# Patient Record
Sex: Male | Born: 1965 | Race: Black or African American | Hispanic: No | Marital: Married | Smoking: Never smoker
Health system: Southern US, Community
[De-identification: ages and names within clinical notes are randomized; demographics above are authoritative.]

## PROBLEM LIST (undated history)

## (undated) DIAGNOSIS — J45909 Unspecified asthma, uncomplicated: Secondary | ICD-10-CM

## (undated) DIAGNOSIS — I10 Essential (primary) hypertension: Secondary | ICD-10-CM

## (undated) DIAGNOSIS — G4730 Sleep apnea, unspecified: Secondary | ICD-10-CM

## (undated) HISTORY — PX: FRACTURE SURGERY: SHX138

## (undated) HISTORY — PX: DIAGNOSTIC LAPAROSCOPY: SUR761

## (undated) HISTORY — PX: ELBOW FRACTURE SURGERY: SHX616

## (undated) HISTORY — PX: LAPAROSCOPIC GASTRIC SLEEVE RESECTION: SHX5895

---

## 2007-05-02 ENCOUNTER — Ambulatory Visit: Payer: Self-pay | Admitting: Internal Medicine

## 2010-11-24 ENCOUNTER — Ambulatory Visit: Payer: Self-pay | Admitting: Sports Medicine

## 2016-02-10 DIAGNOSIS — E668 Other obesity: Secondary | ICD-10-CM | POA: Insufficient documentation

## 2016-02-10 DIAGNOSIS — R12 Heartburn: Secondary | ICD-10-CM | POA: Insufficient documentation

## 2016-02-10 DIAGNOSIS — G4733 Obstructive sleep apnea (adult) (pediatric): Secondary | ICD-10-CM | POA: Insufficient documentation

## 2016-02-10 DIAGNOSIS — I10 Essential (primary) hypertension: Secondary | ICD-10-CM | POA: Insufficient documentation

## 2016-02-12 ENCOUNTER — Other Ambulatory Visit: Payer: Self-pay | Admitting: Bariatrics

## 2016-02-19 ENCOUNTER — Ambulatory Visit
Admission: RE | Admit: 2016-02-19 | Discharge: 2016-02-19 | Disposition: A | Discharge status: Home / Self Care | Payer: BC Managed Care – PPO | Source: Ambulatory Visit | Attending: Bariatrics | Admitting: Bariatrics

## 2016-02-19 DIAGNOSIS — K449 Diaphragmatic hernia without obstruction or gangrene: Secondary | ICD-10-CM | POA: Insufficient documentation

## 2016-03-15 NOTE — Progress Notes (Signed)
Cardiology Office Note   Date:  03/22/2016   ID:  [name], DOB 1965/09/23, MRN [id number]  Referring Doctor:  Lyndon Code, MD   Cardiologist:   Almond Lint, MD   Reason for consultation:  Chief Complaint  Patient presents with  . New Patient (Initial Visit)    NO CP, SOB OR SWELLING. NO OTHER COMPLAINTS.   Preoperative cardiac clearance   History of Present Illness: Luis Hendrix. is a 50 y.o. male who presents for Preoperative evaluation prior to gastric sleeve surgery.  Patient was told that he had an abnormal exercise stress test. He does not report any chest pain or shortness of breath. No palpitations, lightheadedness, syncope.  He would like to proceed with gastric sleeve surgery to help with weight loss. His physical activities limited by history of almost ruptured Achilles tendon on the right leg.   ROS:  Please see the history of present illness. Aside from mentioned under HPI, all other systems are reviewed and negative.     History reviewed. No pertinent past medical history.  HTN OSA Asthma  History reviewed. No pertinent surgical history.   reports that he has never smoked. He has never used smokeless tobacco. He reports that he drinks alcohol.   family history is not on file.   No outpatient prescriptions prior to visit.   No facility-administered medications prior to visit.    Lisinopril Symbicort  Allergies: Review of patient's allergies indicates no known allergies.    PHYSICAL EXAM: VS:  BP (!) 134/100 (BP Location: Left Arm, Patient Position: Sitting, Cuff Size: Large)   Pulse 75   Ht 6\' 1"  (1.854 m)   Wt (!) 333 lb 12.8 oz (151.4 kg)   BMI 44.04 kg/m  , Body mass index is 44.04 kg/m. Wt Readings from Last 3 Encounters:  03/22/16 (!) 333 lb 12.8 oz (151.4 kg)    GENERAL:  well developed, well nourished,morbidly obese, not in acute distress HEENT: normocephalic, pink conjunctivae, anicteric sclerae, no  xanthelasma, normal dentition, oropharynx clear NECK:  no neck vein engorgement, JVP normal, no hepatojugular reflux, carotid upstroke brisk and symmetric, no bruit, no thyromegaly, no lymphadenopathy LUNGS:  good respiratory effort, clear to auscultation bilaterally CV:  PMI not displaced, no thrills, no lifts, S1 and S2 within normal limits, no palpable S3 or S4, no murmurs, no rubs, no gallops ABD:  Soft, nontender, nondistended, normoactive bowel sounds, no abdominal aortic bruit, no hepatomegaly, no splenomegaly MS: nontender back, no kyphosis, no scoliosis, no joint deformities EXT:  2+ DP/PT pulses, no edema, no varicosities, no cyanosis, no clubbing SKIN: warm, nondiaphoretic, normal turgor, no ulcers NEUROPSYCH: alert, oriented to person, place, and time, sensory/motor grossly intact, normal mood, appropriate affect  Recent Labs: No results found for requested labs within last 8760 hours.   Lipid Panel No results found for: CHOL, TRIG, HDL, CHOLHDL, VLDL, LDLCALC, LDLDIRECT   Other studies Reviewed:  EKG:  The ekg from 03/22/2016 was personally reviewed by me and it revealed sinus rhythm, 75 BPM  Additional studies/ records that were reviewed personally reviewed by me today include: None available   ASSESSMENT AND PLAN: Preoperative cardiac evaluation Patient underwent exercise treadmill test 02/25/2016. He did not reach target heart rate despite exercising for more than 11 minutes. He had adequate local ST depression at peak heart rate. It was recommended that he go for nuclear stress test. Echocardiogram was done 02/25/2016. LVEF is 65-70%. No other significant findings. We will  arrange for a pharmacologic nuclear stress test. If this is negative for ischemia, his cardiac risk will be low to intermediate for moderate risk procedure.  Hypertension Blood pressure slightly elevated. PCP managing this.   Current medicines are reviewed at length with the patient today.  The  patient does not have concerns regarding medicines.  Labs/ tests ordered today include:  Orders Placed This Encounter  Procedures  . NM Myocar Multi W/Spect W/Wall Motion / EF  . EKG 12-Lead    I had a lengthy and detailed discussion with the patient regarding diagnoses, prognosis, diagnostic options, treatment options.   I counseled the patient on importance of lifestyle modification including heart healthy diet, regular physical activity.   Disposition:   FU with undersigned after tests prn  I spent at least 60 minutes with the patient today and more than 50% of the time was spent counseling the patient and coordinating care.    Signed, Almond LintAileen Daylin Gruszka, MD  03/22/2016 1:10 PM    [facility]  This note was generated in part with voice recognition software and I apologize for any typographical errors that were not detected and corrected.

## 2016-03-22 ENCOUNTER — Encounter: Payer: Self-pay | Admitting: Cardiology

## 2016-03-22 ENCOUNTER — Encounter (INDEPENDENT_AMBULATORY_CARE_PROVIDER_SITE_OTHER): Payer: Self-pay

## 2016-03-22 ENCOUNTER — Ambulatory Visit (INDEPENDENT_AMBULATORY_CARE_PROVIDER_SITE_OTHER): Payer: BC Managed Care – PPO | Admitting: Cardiology

## 2016-03-22 VITALS — BP 134/100 | HR 75 | Ht 73.0 in | Wt 333.8 lb

## 2016-03-22 DIAGNOSIS — I10 Essential (primary) hypertension: Secondary | ICD-10-CM | POA: Diagnosis not present

## 2016-03-22 DIAGNOSIS — R9439 Abnormal result of other cardiovascular function study: Secondary | ICD-10-CM

## 2016-03-22 DIAGNOSIS — Z01818 Encounter for other preprocedural examination: Secondary | ICD-10-CM | POA: Diagnosis not present

## 2016-03-22 NOTE — Patient Instructions (Addendum)
Testing/Procedures: [facility]  Your caregiver has ordered a Stress Test with nuclear imaging. The purpose of this test is to evaluate the blood supply to your heart muscle. This procedure is referred to as a "Non-Invasive Stress Test." This is because other than having an IV started in your vein, nothing is inserted or "invades" your body. Cardiac stress tests are done to find areas of poor blood flow to the heart by determining the extent of coronary artery disease (CAD).    Please note: these test may take anywhere between 2-4 hours to complete  PLEASE REPORT TO [facility] ENTRANCE  THE VOLUNTEERS AT THE FIRST DESK WILL DIRECT YOU WHERE TO GO  Date of Procedure:_Tuesday September 12 & Wednesday March 30, 2016 at 08:30AM_  Arrival Time for Procedure:_Arrive at 08:15AM to register__   PLEASE NOTIFY THE OFFICE AT LEAST 24 HOURS IN ADVANCE IF YOU ARE UNABLE TO KEEP YOUR APPOINTMENT.  [phone] AND  PLEASE NOTIFY NUCLEAR MEDICINE AT [facility] AT LEAST 24 HOURS IN ADVANCE IF YOU ARE UNABLE TO KEEP YOUR APPOINTMENT. [phone]  How to prepare for your Myoview test:  1. Do not eat or drink after midnight 2. No caffeine for 24 hours prior to test 3. No smoking 24 hours prior to test. 4. Your medication may be taken with water.  If your doctor stopped a medication because of this test, do not take that medication. 5. Ladies, please do not wear dresses.  Skirts or pants are appropriate. Please wear a short sleeve shirt. 6. No perfume, cologne or lotion. 7. Wear comfortable walking shoes. No heels!      Follow-Up: Your physician recommends that you schedule a follow-up appointment as needed. We will call you with results and schedule follow up at that time if needed.  It was a pleasure seeing you today here in the office. Please do not hesitate to give us a call back if you have any further questions. [phone]  Noma CellarPamela A. RN, BSN        Cardiac Nuclear Scanning A  cardiac nuclear scan is used to check your heart for problems, such as the following:  A portion of the heart is not getting enough blood.  Part of the heart muscle has died, which happens with a heart attack.  The heart wall is not working normally.  In this test, a radioactive dye (tracer) is injected into your bloodstream. After the tracer has traveled to your heart, a scanning device is used to measure how much of the tracer is absorbed by or distributed to various areas of your heart. LET [facility] KNOW ABOUT:  Any allergies you have.  All medicines you are taking, including vitamins, herbs, eye drops, creams, and over-the-counter medicines.  Previous problems you or members of your family have had with the use of anesthetics.  Any blood disorders you have.  Previous surgeries you have had.  Medical conditions you have.  RISKS AND COMPLICATIONS Generally, this is a safe procedure. However, as with any procedure, problems can occur. Possible problems include:   Serious chest pain.  Rapid heartbeat.  Sensation of warmth in your chest. This usually passes quickly. BEFORE THE PROCEDURE Ask your health care provider about changing or stopping your regular medicines. PROCEDURE This procedure is usually done at a hospital and takes 2-4 hours.  An IV tube is inserted into one of your veins.  Your health care provider will inject a small amount of radioactive tracer through the tube.  You will then wait for 20-40 minutes while the tracer travels through your bloodstream.  You will lie down on an exam table so images of your heart can be taken. Images will be taken for about 15-20 minutes.  You will exercise on a treadmill or stationary bike. While you exercise, your heart activity will be monitored with an electrocardiogram (ECG), and your blood pressure will be checked.  If you are unable to exercise, you may be given a medicine to make your heart beat  faster.  When blood flow to your heart has peaked, tracer will again be injected through the IV tube.  After 20-40 minutes, you will get back on the exam table and have more images taken of your heart.  When the procedure is over, your IV tube will be removed. AFTER THE PROCEDURE  You will likely be able to leave shortly after the test. Unless your health care provider tells you otherwise, you may return to your normal schedule, including diet, activities, and medicines.  Make sure you find out how and when you will get your test results.   This information is not intended to replace advice given to you by your health care provider. Make sure you discuss any questions you have with your health care provider.   Document Released: 07/29/2004 Document Revised: 07/09/2013 Document Reviewed: 06/12/2013 Elsevier Interactive Patient Education [employer]

## 2016-03-28 ENCOUNTER — Telehealth: Payer: Self-pay | Admitting: Cardiology

## 2016-03-28 NOTE — Telephone Encounter (Signed)
Pt needs to cancel his stress test tomorrow, due to his work schedule. Please call to r/s

## 2016-03-28 NOTE — Telephone Encounter (Signed)
Reviewed with patient time, location, and instructions for stress test. 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 Let him know that he should be finished by that time and he stated to leave appointment as it stands and he will be there. Confirmed all instructions and he verbalized understanding with no further questions.

## 2016-03-29 ENCOUNTER — Other Ambulatory Visit: Payer: BC Managed Care – PPO

## 2016-03-29 ENCOUNTER — Ambulatory Visit
Admission: RE | Admit: 2016-03-29 | Discharge: 2016-03-29 | Disposition: A | Discharge status: Home / Self Care | Payer: BC Managed Care – PPO | Source: Ambulatory Visit | Attending: Cardiology | Admitting: Cardiology

## 2016-03-29 DIAGNOSIS — R9439 Abnormal result of other cardiovascular function study: Secondary | ICD-10-CM | POA: Diagnosis not present

## 2016-03-29 DIAGNOSIS — Z01818 Encounter for other preprocedural examination: Secondary | ICD-10-CM

## 2016-03-29 HISTORY — DX: Unspecified asthma, uncomplicated: J45.909

## 2016-03-29 HISTORY — DX: Sleep apnea, unspecified: G47.30

## 2016-03-29 HISTORY — DX: Essential (primary) hypertension: I10

## 2016-03-29 LAB — NM MYOCAR MULTI W/SPECT W/WALL MOTION / EF
Estimated workload: 1 METS
Exercise duration (min): 0 min
Exercise duration (sec): 0 s
MPHR: 173 {beats}/min
Peak HR: 83 {beats}/min
Percent HR: 47 %
Rest HR: 74 {beats}/min

## 2016-03-29 MED ORDER — TECHNETIUM TC 99M TETROFOSMIN IV KIT
31.5200 | PACK | Freq: Once | INTRAVENOUS | Status: AC | PRN
  Administered 2016-03-29: 31.52 via INTRAVENOUS

## 2016-03-29 MED ORDER — REGADENOSON 0.4 MG/5ML IV SOLN
0.4000 mg | Freq: Once | INTRAVENOUS | Status: AC
  Administered 2016-03-29: 0.4 mg via INTRAVENOUS
  Filled 2016-03-29: qty 5

## 2016-03-30 ENCOUNTER — Encounter: Admission: RE | Admit: 2016-03-30 | Payer: BC Managed Care – PPO | Source: Ambulatory Visit

## 2016-03-30 ENCOUNTER — Other Ambulatory Visit: Payer: BC Managed Care – PPO

## 2016-04-08 ENCOUNTER — Telehealth: Payer: Self-pay | Admitting: Cardiology

## 2016-04-08 NOTE — Telephone Encounter (Signed)
Received cardiac clearance request from [facility] for bariatric surgery. Forms placed in red folder in "To Do" bin on Pamela's desk.

## 2016-04-11 ENCOUNTER — Telehealth: Payer: Self-pay | Admitting: Cardiology

## 2016-04-11 NOTE — Telephone Encounter (Signed)
Cardiac clearance form faxed to [facility] and office visit note with stress test results routed through Epic as well. Original form placed in "Faxed" bin on Raneen Jaffer's desk.

## 2017-05-05 IMAGING — RF DG UGI W/O KUB
10 series · 10 of 10 positions shown · non-contrast
Comparison: None.

CLINICAL DATA: Pre bariatric evaluation

EXAM:
UPPER GI SERIES WITHOUT KUB
TECHNIQUE: Routine upper GI series was performed with thin/high density/water
soluble barium. Effervescent crystals and a barium tablet were
administered
FLUOROSCOPY TIME:  Fluoroscopy Time (in minutes and seconds): 1
minutes, 36 seconds
Number of Acquired Images:  10

[Series 1: fluoro_barium 2fps_bw · 0.17mm/px · 1 of 1 slices shown (1 of 10)]
[im 1/1]
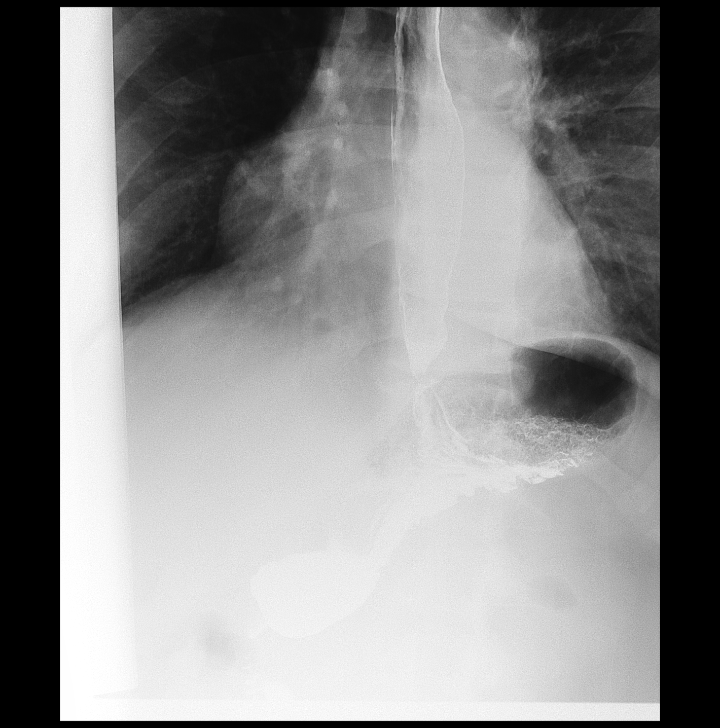

[Series 2: fluoro_barium 2fps_bw · 0.18mm/px · 1 of 1 slices shown (2 of 10)]
[im 1/1]
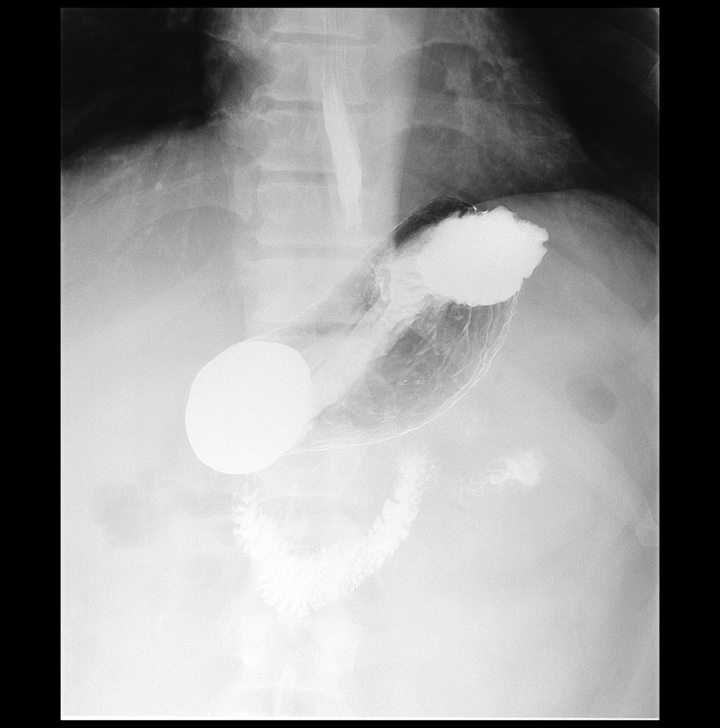

[Series 3: fluoro_barium 2fps_bw · 0.18mm/px · 1 of 1 slices shown (3 of 10)]
[im 1/1]
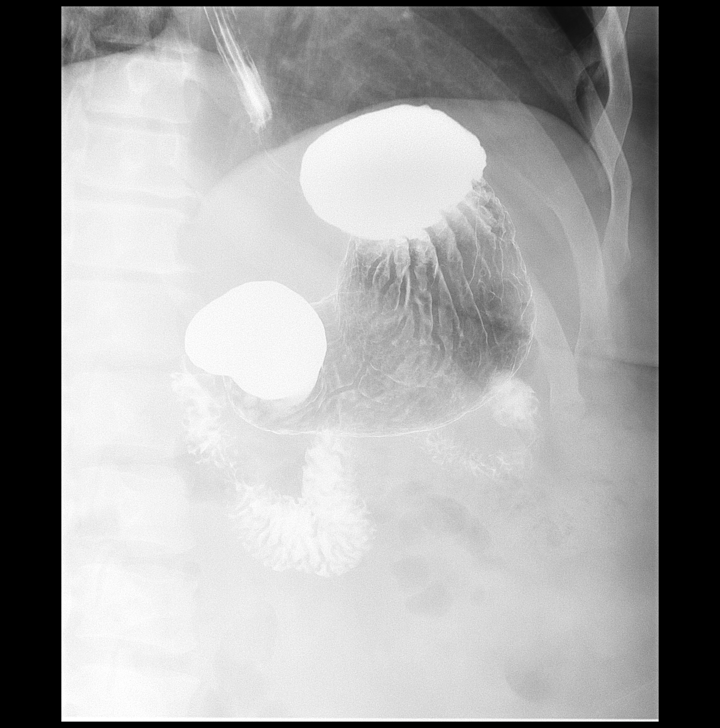

[Series 4: fluoro_barium 2fps_bw · 0.18mm/px · 1 of 1 slices shown (4 of 10)]
[im 1/1]
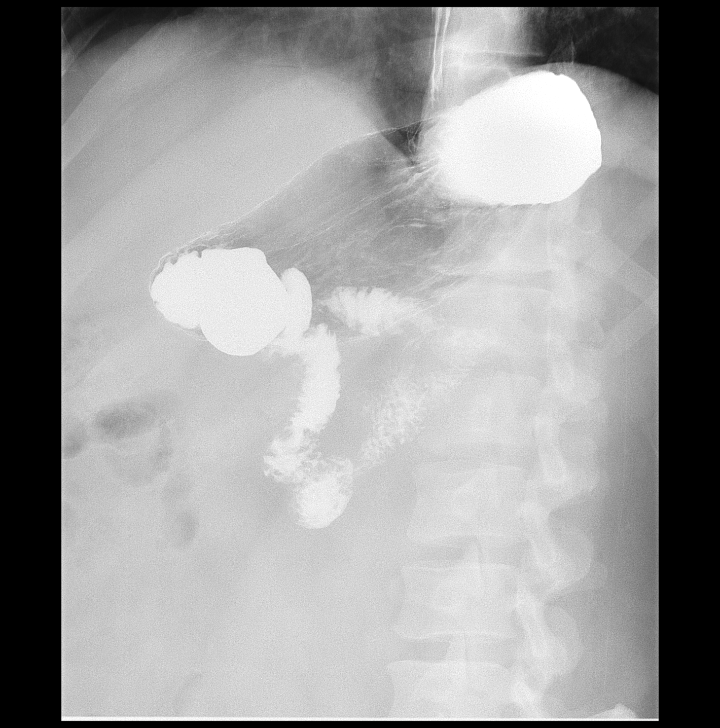

[Series 5: fluoro_barium 2fps_bw · 0.18mm/px · 1 of 1 slices shown (5 of 10)]
[im 1/1]
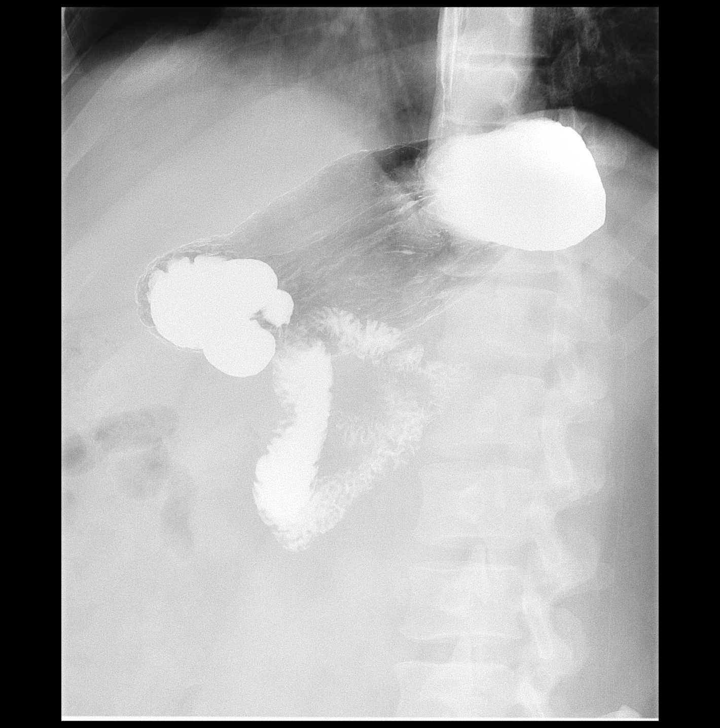

[Series 6: fluoro_barium 2fps_bw · 0.18mm/px · 1 of 1 slices shown (6 of 10)]
[im 1/1]
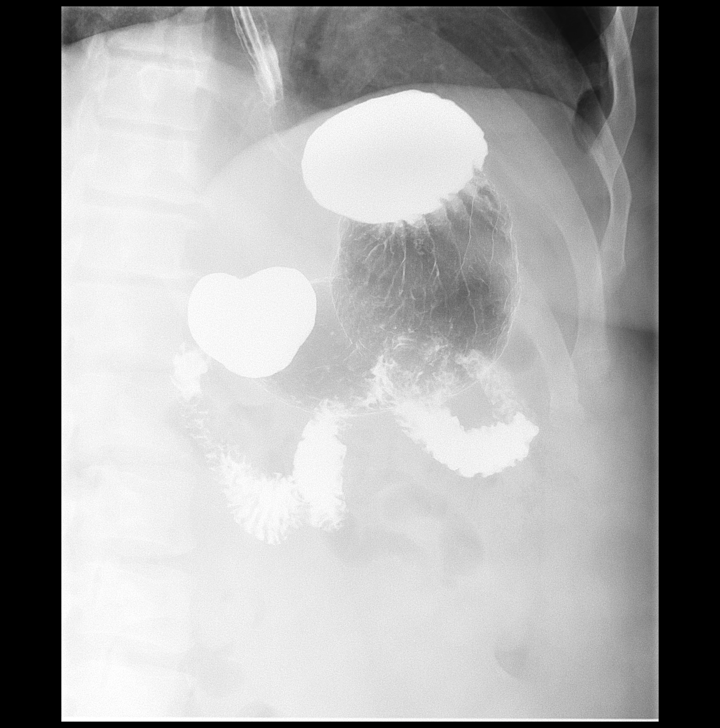

[Series 7: fluoro_barium 2fps_bw · 0.18mm/px · 1 of 1 slices shown (7 of 10)]
[im 1/1]
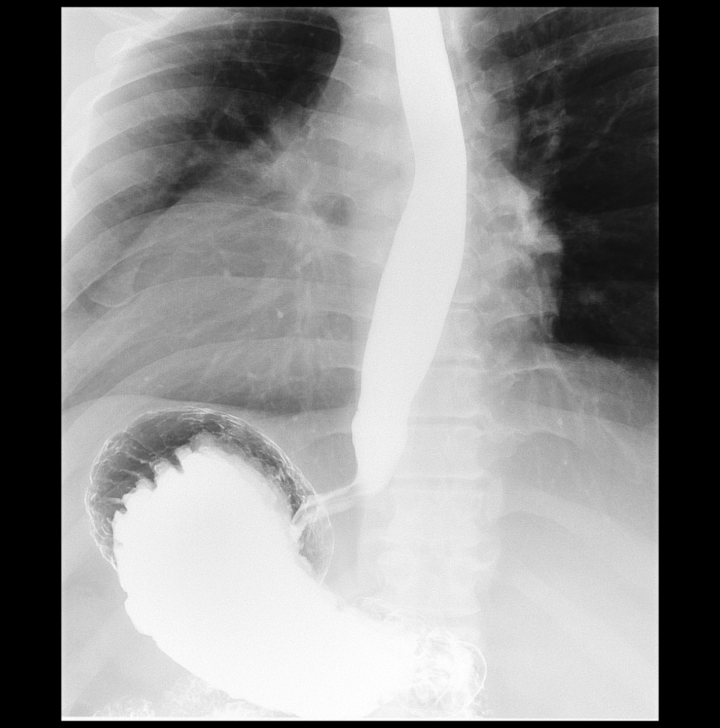

[Series 8: fluoro_barium 2fps_bw · 0.18mm/px · 1 of 1 slices shown (8 of 10)]
[im 1/1]
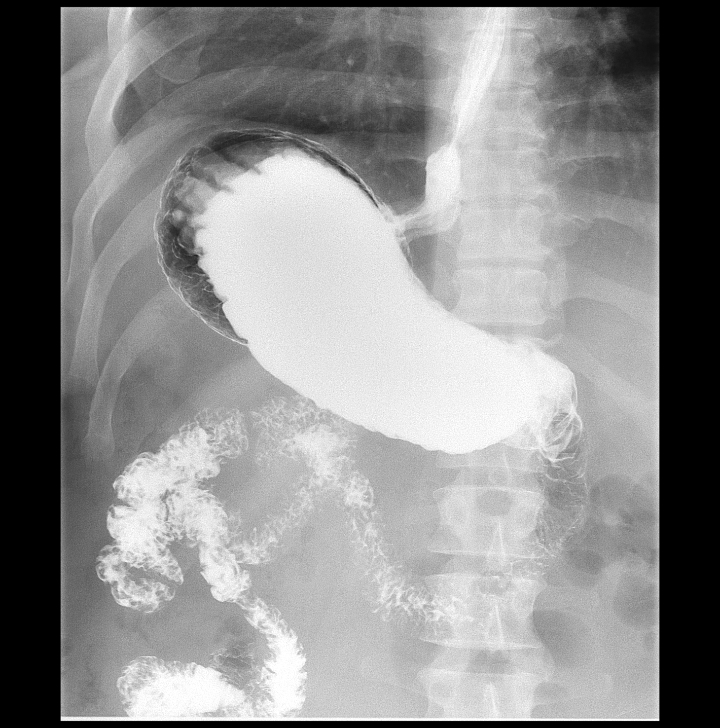

[Series 9: fluoro_barium 2fps_bw · 0.17mm/px · 1 of 1 slices shown (9 of 10)]
[im 1/1]
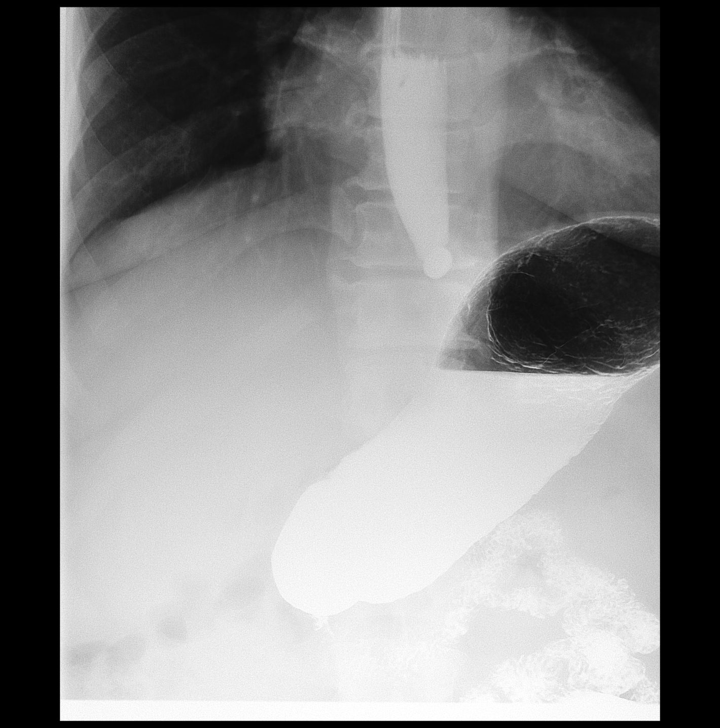

[Series 10: fluoro_barium 2fps_bw · 0.18mm/px · 1 of 1 slices shown (10 of 10)]
[im 1/1]
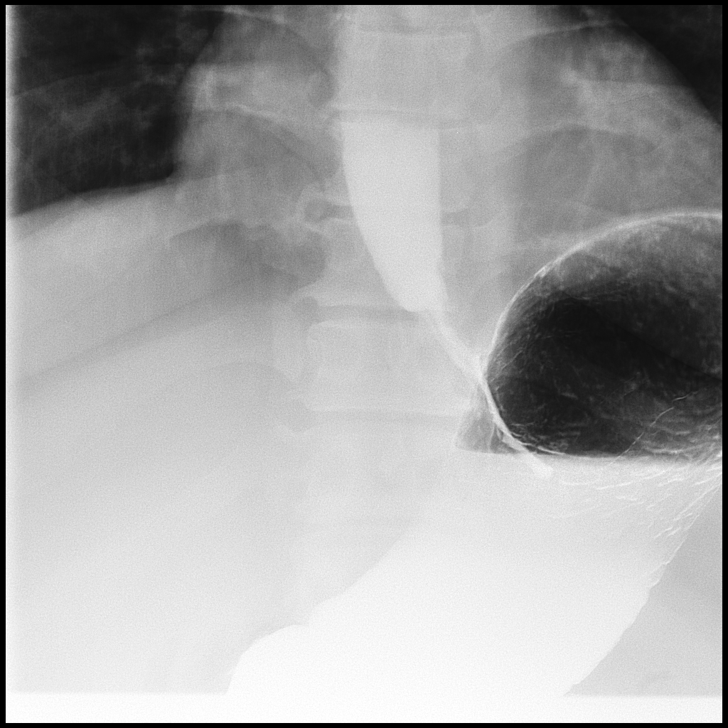

[10 of 10 positions shown; findings below may reference images not displayed]

FINDINGS: The patient ingested the thick and thin barium and gas forming
crystals without difficulty. The thoracic esophagus was normal in a
survey fashion. A small reducible hiatal hernia was observed. The
stomach distended well. The mucosal fold pattern was normal. There
was no ulcer niche. Gastric emptying was prompt. The duodenal bulb
and C-loop were normal in appearance and position. The barium tablet
passed promptly from the mouth to the distal esophagus where it
hung. It would not pass even with additional sips of water and
barium and was allowed to dissolve.
IMPRESSION: 1. Obstruction to passage of the barium tablet at the GE junction
which is the site of a small reducible hiatal hernia. No obstruction
to passage of liquid barium. The mucosa of the distal esophagus
appears normal.
2. Normal appearance of the stomach and duodenum.

## 2017-12-15 ENCOUNTER — Ambulatory Visit: Payer: BC Managed Care – PPO | Admitting: Nurse Practitioner

## 2017-12-15 ENCOUNTER — Encounter: Payer: Self-pay | Admitting: Nurse Practitioner

## 2017-12-15 VITALS — BP 128/86 | HR 72 | Resp 16 | Ht 73.0 in | Wt 275.0 lb

## 2017-12-15 DIAGNOSIS — E668 Other obesity: Secondary | ICD-10-CM | POA: Diagnosis not present

## 2017-12-15 DIAGNOSIS — J45909 Unspecified asthma, uncomplicated: Secondary | ICD-10-CM | POA: Insufficient documentation

## 2017-12-15 DIAGNOSIS — I10 Essential (primary) hypertension: Secondary | ICD-10-CM | POA: Diagnosis not present

## 2017-12-15 MED ORDER — PHENTERMINE HCL 37.5 MG PO TABS: 37.5000 mg | ORAL_TABLET | Freq: Every day | ORAL | 1 refills | Status: DC

## 2017-12-15 NOTE — Progress Notes (Signed)
[facility] [address] [place], [place] [address]  Internal MEDICINE  Office Visit Note  Patient Name: Luis Hendrix  [id number]  [id number]  Date of Service: 12/15/2017  Chief Complaint  Patient presents with  . Hypertension    follow up  . Weight Gain    3 or 4 punds over past 6 weeks or so.     The patient is concerned about recent weight gain of 3-4 pounds in past 6 weeks. Did speak with his bariatric doctor. Suggested he try appetite suppressant for short period of time.  States that he is fairly active. Works out 4 to 5 times per week and is very active around the house. States that reintroduction of carbs and comfort foods has hurt him.  Blood pressure is slightly elevated today. States that he checks this every day at home and generally running in 120s over 80s.    Pt is here for routine follow up.    Current Medication: Outpatient Encounter Medications as of 12/15/2017  Medication Sig Note  . albuterol (PROAIR HFA) 108 (90 Base) MCG/ACT inhaler ProAir HFA 90 mcg/actuation aerosol inhaler   . colchicine 0.6 MG tablet colchicine 0.6 mg tablet   . lisinopril (PRINIVIL,ZESTRIL) 10 MG tablet Take 10 mg by mouth daily. 03/22/2016: Received from: External Pharmacy  . SYMBICORT 160-4.5 MCG/ACT inhaler Inhale 1 puff into the lungs 2 (two) times daily as needed. 03/22/2016: Received from: External Pharmacy  . phentermine (ADIPEX-P) 37.5 MG tablet Take 1 tablet (37.5 mg total) by mouth daily before breakfast.    No facility-administered encounter medications on file as of 12/15/2017.     Surgical History: Past Surgical History:  Procedure Laterality Date  . ELBOW FRACTURE SURGERY Right    Approx age 52  . LAPAROSCOPIC GASTRIC SLEEVE RESECTION      Medical History: Past Medical History:  Diagnosis Date  . Asthma   . Hypertension   . Sleep apnea     Family History: Family History  Problem Relation Age of Onset  . Hypertension Father     Social  History   Socioeconomic History  . Marital status: Married    Spouse name: Not on file  . Number of children: Not on file  . Years of education: Not on file  . Highest education level: Not on file  Occupational History  . Not on file  Social Needs  . Financial resource strain: Not on file  . Food insecurity:    Worry: Not on file    Inability: Not on file  . Transportation needs:    Medical: Not on file    Non-medical: Not on file  Tobacco Use  . Smoking status: Never Smoker  . Smokeless tobacco: Never Used  Substance and Sexual Activity  . Alcohol use: Yes    Comment: OCCASIONALLY  . Drug use: No  . Sexual activity: Yes    Partners: Female  Lifestyle  . Physical activity:    Days per week: Not on file    Minutes per session: Not on file  . Stress: Not on file  Relationships  . Social connections:    Talks on phone: Not on file    Gets together: Not on file    Attends religious service: Not on file    Active member of club or organization: Not on file    Attends meetings of clubs or organizations: Not on file    Relationship status: Not on file  . Intimate partner  violence:    Fear of current or ex partner: Not on file    Emotionally abused: Not on file    Physically abused: Not on file    Forced sexual activity: Not on file  Other Topics Concern  . Not on file  Social History Narrative  . Not on file      Review of Systems  Constitutional: Negative for activity change, chills, fatigue and unexpected weight change.       Recent weight gain of 3-4 pounds.   HENT: Negative for congestion, postnasal drip, rhinorrhea, sneezing and sore throat.   Eyes: Negative.  Negative for redness.  Respiratory: Negative for cough, chest tightness, shortness of breath and wheezing.   Cardiovascular: Negative for chest pain and palpitations.  Gastrointestinal: Negative for abdominal pain, constipation, diarrhea, nausea and vomiting.  Endocrine: Negative for cold intolerance,  heat intolerance, polydipsia, polyphagia and polyuria.  Genitourinary: Negative.  Negative for dysuria and frequency.  Musculoskeletal: Negative for arthralgias, back pain, joint swelling and neck pain.  Skin: Negative for rash.  Allergic/Immunologic: Positive for environmental allergies.  Neurological: Negative for tremors, numbness and headaches.  Hematological: Negative for adenopathy. Does not bruise/bleed easily.  Psychiatric/Behavioral: Negative for behavioral problems (Depression), sleep disturbance and suicidal ideas. The patient is not nervous/anxious.     Vital Signs: BP 128/86   Pulse 72   Resp 16   Ht 6\' 1"  (1.854 m)   Wt 275 lb (124.7 kg)   SpO2 98%   BMI 36.28 kg/m    Physical Exam  Constitutional: He is oriented to person, place, and time. He appears well-developed and well-nourished. No distress.  HENT:  Head: Normocephalic and atraumatic.  Nose: Nose normal.  Mouth/Throat: Oropharynx is clear and moist. No oropharyngeal exudate.  Eyes: Pupils are equal, round, and reactive to light. EOM are normal.  Neck: Normal range of motion. Neck supple. No JVD present. No tracheal deviation present. No thyromegaly present.  Cardiovascular: Normal rate, regular rhythm and normal heart sounds. Exam reveals no gallop and no friction rub.  No murmur heard. Pulmonary/Chest: Effort normal and breath sounds normal. No respiratory distress. He has no wheezes. He has no rales. He exhibits no tenderness.  Abdominal: Soft. Bowel sounds are normal. There is no tenderness.  Musculoskeletal: Normal range of motion.  Lymphadenopathy:    He has no cervical adenopathy.  Neurological: He is alert and oriented to person, place, and time. No cranial nerve deficit.  Skin: Skin is warm and dry. He is not diaphoretic.  Psychiatric: He has a normal mood and affect. His behavior is normal. Judgment and thought content normal.  Nursing note and vitals reviewed.   Assessment/Plan: 1. Essential  hypertension Stable. Continue bp medication as prescribed.  2. Moderate asthma without complication, unspecified whether persistent Symbicort twice daily. Rescue inhaler as needed and as prescribed.   3. Moderate obesity Add phentermine 37.5mg  tablets. Reviewed importance of following 1800 calorie diet. Continue routine exercise program.  - phentermine (ADIPEX-P) 37.5 MG tablet; Take 1 tablet (37.5 mg total) by mouth daily before breakfast.  Dispense: 30 tablet; Refill: 1   General Counseling: [name] verbalizes understanding of the findings of todays visit and agrees with plan of treatment. I have discussed any further diagnostic evaluation that may be needed or ordered today. We also reviewed his medications today. he has been encouraged to call the office with any questions or concerns that should arise related to todays visit.   There is a liability release in patients' chart.  There has been a 10 minute discussion about the side effects including but not limited to elevated blood pressure, anxiety, lack of sleep and dry mouth. Pt understands and will like to start/continue on appetite suppressant at this time. There will be one month RX given at the time of visit with proper follow up. Nova diet plan with restricted calories is given to the pt. Pt understands and agrees with  plan of treatment  This patient was seen by Vincent Gros, FNP- C in Collaboration with Dr Lyndon Code as a part of collaborative care agreement   Meds ordered this encounter  Medications  . phentermine (ADIPEX-P) 37.5 MG tablet    Sig: Take 1 tablet (37.5 mg total) by mouth daily before breakfast.    Dispense:  30 tablet    Refill:  1    Order Specific Question:   Supervising Provider    Answer:   Lyndon Code [[id number]]    Time spent: 17 Minutes     Dr Lyndon Code Internal medicine

## 2018-01-19 ENCOUNTER — Encounter: Payer: Self-pay | Admitting: Nurse Practitioner

## 2018-01-19 ENCOUNTER — Ambulatory Visit: Payer: BC Managed Care – PPO | Admitting: Nurse Practitioner

## 2018-01-19 ENCOUNTER — Encounter (INDEPENDENT_AMBULATORY_CARE_PROVIDER_SITE_OTHER): Payer: Self-pay

## 2018-01-19 VITALS — BP 118/85 | HR 82 | Resp 16 | Ht 73.0 in | Wt 263.0 lb

## 2018-01-19 DIAGNOSIS — J45909 Unspecified asthma, uncomplicated: Secondary | ICD-10-CM | POA: Diagnosis not present

## 2018-01-19 DIAGNOSIS — I10 Essential (primary) hypertension: Secondary | ICD-10-CM

## 2018-01-19 DIAGNOSIS — E668 Other obesity: Secondary | ICD-10-CM

## 2018-01-19 MED ORDER — PHENTERMINE HCL 37.5 MG PO TABS: 37.5000 mg | ORAL_TABLET | Freq: Every day | ORAL | 1 refills | Status: DC

## 2018-01-19 NOTE — Progress Notes (Signed)
[facility] [address] [place], [place] [address]  Internal MEDICINE  Office Visit Note  Patient Name: Luis Hendrix.  [id number]  [id number]  Date of Service: 01/19/2018   Pt is here for routine follow up  Chief Complaint  Patient presents with  . Other     follow up    The patient is here for weight loss follow up. Restarted phentermine 37.5mg  tablets. Has lost 12 pounds since his last visit. Has cut out carbs and carbonated drinks. Splurges very seldom. Increased intensity of work outs. Feeling well and has no negative side effects .   Marland Kitchen.    Current Medication: Outpatient Encounter Medications as of 01/19/2018  Medication Sig Note  . albuterol (PROAIR HFA) 108 (90 Base) MCG/ACT inhaler ProAir HFA 90 mcg/actuation aerosol inhaler   . colchicine 0.6 MG tablet colchicine 0.6 mg tablet   . lisinopril (PRINIVIL,ZESTRIL) 10 MG tablet Take 10 mg by mouth daily. 03/22/2016: Received from: External Pharmacy  . phentermine (ADIPEX-P) 37.5 MG tablet Take 1 tablet (37.5 mg total) by mouth daily before breakfast.   . SYMBICORT 160-4.5 MCG/ACT inhaler Inhale 1 puff into the lungs 2 (two) times daily as needed. 03/22/2016: Received from: External Pharmacy  . [DISCONTINUED] phentermine (ADIPEX-P) 37.5 MG tablet Take 1 tablet (37.5 mg total) by mouth daily before breakfast.    No facility-administered encounter medications on file as of 01/19/2018.     Surgical History: Past Surgical History:  Procedure Laterality Date  . ELBOW FRACTURE SURGERY Right    Approx age 89  . LAPAROSCOPIC GASTRIC SLEEVE RESECTION      Medical History: Past Medical History:  Diagnosis Date  . Asthma   . Hypertension   . Sleep apnea     Family History: Family History  Problem Relation Age of Onset  . Hypertension Father     Social History   Socioeconomic History  . Marital status: Married    Spouse name: Not on file  . Number of children: Not on file  . Years of education: Not on  file  . Highest education level: Not on file  Occupational History  . Not on file  Social Needs  . Financial resource strain: Not on file  . Food insecurity:    Worry: Not on file    Inability: Not on file  . Transportation needs:    Medical: Not on file    Non-medical: Not on file  Tobacco Use  . Smoking status: Never Smoker  . Smokeless tobacco: Never Used  Substance and Sexual Activity  . Alcohol use: Yes    Comment: OCCASIONALLY  . Drug use: No  . Sexual activity: Yes    Partners: Female  Lifestyle  . Physical activity:    Days per week: Not on file    Minutes per session: Not on file  . Stress: Not on file  Relationships  . Social connections:    Talks on phone: Not on file    Gets together: Not on file    Attends religious service: Not on file    Active member of club or organization: Not on file    Attends meetings of clubs or organizations: Not on file    Relationship status: Not on file  . Intimate partner violence:    Fear of current or ex partner: Not on file    Emotionally abused: Not on file    Physically abused: Not on file    Forced sexual activity: Not on  file  Other Topics Concern  . Not on file  Social History Narrative  . Not on file      Review of Systems  Constitutional: Negative for activity change, chills, fatigue and unexpected weight change.       Weight loss of 12 pounds since most recent visit.   HENT: Negative for congestion, postnasal drip, rhinorrhea, sneezing and sore throat.   Eyes: Negative.  Negative for redness.  Respiratory: Negative for cough, chest tightness, shortness of breath and wheezing.   Cardiovascular: Negative for chest pain and palpitations.  Gastrointestinal: Negative for abdominal pain, constipation, diarrhea, nausea and vomiting.  Endocrine: Negative for cold intolerance, heat intolerance, polydipsia, polyphagia and polyuria.  Genitourinary: Negative.  Negative for dysuria and frequency.  Musculoskeletal:  Negative for arthralgias, back pain, joint swelling and neck pain.  Skin: Negative for rash.  Allergic/Immunologic: Positive for environmental allergies.  Neurological: Negative for tremors, numbness and headaches.  Hematological: Negative for adenopathy. Does not bruise/bleed easily.  Psychiatric/Behavioral: Negative for behavioral problems (Depression), sleep disturbance and suicidal ideas. The patient is not nervous/anxious.     Vital Signs: BP 118/85   Pulse 82   Resp 16   Ht 6\' 1"  (1.854 m)   Wt 263 lb (119.3 kg)   SpO2 96%   BMI 34.70 kg/m    Physical Exam  Constitutional: He is oriented to person, place, and time. He appears well-developed and well-nourished. No distress.  HENT:  Head: Normocephalic and atraumatic.  Nose: Nose normal.  Mouth/Throat: Oropharynx is clear and moist. No oropharyngeal exudate.  Eyes: Pupils are equal, round, and reactive to light. EOM are normal.  Neck: Normal range of motion. Neck supple. No JVD present. No tracheal deviation present. No thyromegaly present.  Cardiovascular: Normal rate, regular rhythm and normal heart sounds. Exam reveals no gallop and no friction rub.  No murmur heard. Pulmonary/Chest: Effort normal and breath sounds normal. No respiratory distress. He has no wheezes. He has no rales. He exhibits no tenderness.  Abdominal: Soft. Bowel sounds are normal. There is no tenderness.  Musculoskeletal: Normal range of motion.  Lymphadenopathy:    He has no cervical adenopathy.  Neurological: He is alert and oriented to person, place, and time. No cranial nerve deficit.  Skin: Skin is warm and dry. Capillary refill takes less than 2 seconds. He is not diaphoretic.  Psychiatric: He has a normal mood and affect. His behavior is normal. Judgment and thought content normal.  Nursing note and vitals reviewed.  Assessment/Plan:  1. Essential hypertension Blood pressure doing very well. Continue lisinopril/hctz as prescribed  2.  Moderate asthma without complication, unspecified whether persistent  symbicort twice daily. Use rescue inhaler as needed and as prescribed.   3. Moderate obesity Improving. Continue phentermine as prescribed. Continue with dietary changes and increased exercise.  - phentermine (ADIPEX-P) 37.5 MG tablet; Take 1 tablet (37.5 mg total) by mouth daily before breakfast.  General Counseling: Armstead Peaks understanding of the findings of todays visit and agrees with plan of treatment. I have discussed any further diagnostic evaluation that may be needed or ordered today. We also reviewed his medications today. he has been encouraged to call the office with any questions or concerns that should arise related to todays visit.    Counseling:   There is a liability release in patients' chart. There has been a 10 minute discussion about the side effects including but not limited to elevated blood pressure, anxiety, lack of sleep and dry mouth. Pt understands  and will like to start/continue on appetite suppressant at this time. There will be one month RX given at the time of visit with proper follow up. Nova diet plan with restricted calories is given to the pt. Pt understands and agrees with  plan of treatment  This patient was seen by Vincent Gros, FNP- C in Collaboration with Dr Lyndon Code as a part of collaborative care agreement    Meds ordered this encounter  Medications  . phentermine (ADIPEX-P) 37.5 MG tablet    Sig: Take 1 tablet (37.5 mg total) by mouth daily before breakfast.    Dispense:  30 tablet    Refill:  1    Order Specific Question:   Supervising Provider    Answer:   Lyndon Code [[id number]]    Time spent: 24 Minutes          Dr Lyndon Code Internal medicine

## 2018-03-02 ENCOUNTER — Ambulatory Visit: Payer: BC Managed Care – PPO | Admitting: Nurse Practitioner

## 2018-03-02 ENCOUNTER — Encounter: Payer: Self-pay | Admitting: Nurse Practitioner

## 2018-03-02 VITALS — BP 124/86 | HR 83 | Resp 16 | Ht 74.0 in | Wt 265.0 lb

## 2018-03-02 DIAGNOSIS — J45909 Unspecified asthma, uncomplicated: Secondary | ICD-10-CM | POA: Diagnosis not present

## 2018-03-02 DIAGNOSIS — I10 Essential (primary) hypertension: Secondary | ICD-10-CM | POA: Diagnosis not present

## 2018-03-02 DIAGNOSIS — E668 Other obesity: Secondary | ICD-10-CM

## 2018-03-02 MED ORDER — PHENTERMINE HCL 37.5 MG PO TABS: 37.5000 mg | ORAL_TABLET | Freq: Every day | ORAL | 1 refills | Status: DC

## 2018-03-02 NOTE — Progress Notes (Signed)
[facility] [address] [place], [place] [address]   Internal MEDICINE  Office Visit Note  Patient Name: Luis PieriniGeorge L Barresi Jr.  [id number]  [id number]  Date of Service: 03/02/2018  Chief Complaint  Patient presents with  . Obesity    6wk weight management follow up    The patient has had a 2 pound weight gain since his last visit. Is currently taking phentermine to help reduce appetite. Is eating a 1500 calorie diet and is exercising more frequently. Has maintained good blood blood pressure and is feeling well.       Current Medication: Outpatient Encounter Medications as of 03/02/2018  Medication Sig Note  . albuterol (PROAIR HFA) 108 (90 Base) MCG/ACT inhaler ProAir HFA 90 mcg/actuation aerosol inhaler   . colchicine 0.6 MG tablet colchicine 0.6 mg tablet   . lisinopril (PRINIVIL,ZESTRIL) 10 MG tablet Take 10 mg by mouth daily. 03/22/2016: Received from: External Pharmacy  . phentermine (ADIPEX-P) 37.5 MG tablet Take 1 tablet (37.5 mg total) by mouth daily before breakfast.   . SYMBICORT 160-4.5 MCG/ACT inhaler Inhale 1 puff into the lungs 2 (two) times daily as needed. 03/22/2016: Received from: External Pharmacy  . [DISCONTINUED] phentermine (ADIPEX-P) 37.5 MG tablet Take 1 tablet (37.5 mg total) by mouth daily before breakfast.    No facility-administered encounter medications on file as of 03/02/2018.     Surgical History: Past Surgical History:  Procedure Laterality Date  . ELBOW FRACTURE SURGERY Right    Approx age 89  . LAPAROSCOPIC GASTRIC SLEEVE RESECTION      Medical History: Past Medical History:  Diagnosis Date  . Asthma   . Hypertension   . Sleep apnea     Family History: Family History  Problem Relation Age of Onset  . Hypertension Father     Social History   Socioeconomic History  . Marital status: Married    Spouse name: Not on file  . Number of children: Not on file  . Years of education: Not on file  . Highest education level:  Not on file  Occupational History  . Not on file  Social Needs  . Financial resource strain: Not on file  . Food insecurity:    Worry: Not on file    Inability: Not on file  . Transportation needs:    Medical: Not on file    Non-medical: Not on file  Tobacco Use  . Smoking status: Never Smoker  . Smokeless tobacco: Never Used  Substance and Sexual Activity  . Alcohol use: Yes    Comment: OCCASIONALLY  . Drug use: No  . Sexual activity: Yes    Partners: Female  Lifestyle  . Physical activity:    Days per week: Not on file    Minutes per session: Not on file  . Stress: Not on file  Relationships  . Social connections:    Talks on phone: Not on file    Gets together: Not on file    Attends religious service: Not on file    Active member of club or organization: Not on file    Attends meetings of clubs or organizations: Not on file    Relationship status: Not on file  . Intimate partner violence:    Fear of current or ex partner: Not on file    Emotionally abused: Not on file    Physically abused: Not on file    Forced sexual activity: Not on file  Other Topics Concern  . Not on  file  Social History Narrative  . Not on file      Review of Systems  Constitutional: Negative for activity change, chills, fatigue and unexpected weight change.       Weight loss of 2 pounds since most recent visit.   HENT: Negative for congestion, postnasal drip, rhinorrhea, sneezing and sore throat.   Eyes: Negative.  Negative for redness.  Respiratory: Negative for cough, chest tightness, shortness of breath and wheezing.   Cardiovascular: Negative for chest pain and palpitations.  Gastrointestinal: Negative for abdominal pain, constipation, diarrhea, nausea and vomiting.  Endocrine: Negative for cold intolerance, heat intolerance, polydipsia, polyphagia and polyuria.  Genitourinary: Negative.  Negative for dysuria and frequency.  Musculoskeletal: Negative for arthralgias, back pain,  joint swelling and neck pain.  Skin: Negative for rash.  Allergic/Immunologic: Positive for environmental allergies.  Neurological: Negative for tremors, numbness and headaches.  Hematological: Negative for adenopathy. Does not bruise/bleed easily.  Psychiatric/Behavioral: Negative for behavioral problems (Depression), sleep disturbance and suicidal ideas. The patient is not nervous/anxious.     Vital Signs: BP 124/86 (BP Location: Right Arm, Patient Position: Sitting, Cuff Size: Large)   Pulse 83   Resp 16   Ht 6\' 2"  (1.88 m)   Wt 265 lb (120.2 kg)   HC 16" (40.6 cm)   SpO2 99%   BMI 34.02 kg/m    Physical Exam  Constitutional: He is oriented to person, place, and time. He appears well-developed and well-nourished. No distress.  HENT:  Head: Normocephalic and atraumatic.  Nose: Nose normal.  Mouth/Throat: Oropharynx is clear and moist. No oropharyngeal exudate.  Eyes: Pupils are equal, round, and reactive to light. Conjunctivae and EOM are normal.  Neck: Normal range of motion. Neck supple. No JVD present. No tracheal deviation present. No thyromegaly present.  Cardiovascular: Normal rate, regular rhythm and normal heart sounds. Exam reveals no gallop and no friction rub.  No murmur heard. Pulmonary/Chest: Effort normal and breath sounds normal. No respiratory distress. He has no wheezes. He has no rales. He exhibits no tenderness.  Abdominal: Soft. Bowel sounds are normal. There is no tenderness.  Musculoskeletal: Normal range of motion.  Lymphadenopathy:    He has no cervical adenopathy.  Neurological: He is alert and oriented to person, place, and time. No cranial nerve deficit.  Skin: Skin is warm and dry. Capillary refill takes less than 2 seconds. He is not diaphoretic.  Psychiatric: He has a normal mood and affect. His behavior is normal. Judgment and thought content normal.  Nursing note and vitals reviewed.  Assessment/Plan: 1. Essential hypertension Stable.  Continue bp medication as prescribed   2. Moderate asthma without complication, unspecified whether persistent Continue inhalers as needed and as prescribed   3. Moderate obesity rnewed phentermine 37.5mg  tablets daily. Continue with 1500 calorie diet and increased level of exercise.  - phentermine (ADIPEX-P) 37.5 MG tablet; Take 1 tablet (37.5 mg total) by mouth daily before breakfast.  Dispense: 30 tablet; Refill: 1  General Counseling: [place] verbalizes understanding of the findings of todays visit and agrees with plan of treatment. I have discussed any further diagnostic evaluation that may be needed or ordered today. We also reviewed his medications today. he has been encouraged to call the office with any questions or concerns that should arise related to todays visit.    There is a liability release in patients' chart. There has been a 10 minute discussion about the side effects including but not limited to elevated blood pressure, anxiety, lack  of sleep and dry mouth. Pt understands and will like to start/continue on appetite suppressant at this time. There will be one month RX given at the time of visit with proper follow up. Nova diet plan with restricted calories is given to the pt. Pt understands and agrees with  plan of treatment  This patient was seen by Vincent GrosHeather Avangelina Flight FNP Collaboration with Dr Lyndon CodeFozia M Khan as a part of collaborative care agreement   Meds ordered this encounter  Medications  . phentermine (ADIPEX-P) 37.5 MG tablet    Sig: Take 1 tablet (37.5 mg total) by mouth daily before breakfast.    Dispense:  30 tablet    Refill:  1    Order Specific Question:   Supervising Provider    Answer:   Lyndon CodeKHAN, FOZIA M [[id number]]    Time spent: 5915 Minutes      Dr Lyndon CodeFozia M Khan Internal medicine

## 2018-03-05 ENCOUNTER — Ambulatory Visit: Payer: BC Managed Care – PPO | Admitting: Internal Medicine

## 2018-03-05 ENCOUNTER — Encounter: Payer: Self-pay | Admitting: Internal Medicine

## 2018-03-05 VITALS — BP 140/80 | HR 77 | Resp 16 | Ht 74.0 in | Wt 268.0 lb

## 2018-03-05 DIAGNOSIS — R0602 Shortness of breath: Secondary | ICD-10-CM | POA: Diagnosis not present

## 2018-03-05 DIAGNOSIS — J45909 Unspecified asthma, uncomplicated: Secondary | ICD-10-CM | POA: Diagnosis not present

## 2018-03-05 DIAGNOSIS — K219 Gastro-esophageal reflux disease without esophagitis: Secondary | ICD-10-CM

## 2018-03-05 DIAGNOSIS — E668 Other obesity: Secondary | ICD-10-CM | POA: Diagnosis not present

## 2018-03-05 DIAGNOSIS — G4733 Obstructive sleep apnea (adult) (pediatric): Secondary | ICD-10-CM

## 2018-03-05 NOTE — Patient Instructions (Signed)

## 2018-03-05 NOTE — Progress Notes (Signed)
[facility] [address] [place], [place] [address]  Pulmonary Sleep Medicine   Office Visit Note  Patient Name: Luis PieriniGeorge L Dress Jr. DOB: 05/12/1966 MRN [id number]  Date of Service: 03/05/2018  Complaints/HPI: He is doing welll overall. Patient states that he has been losing weight since his bariatric surgery. Patient states that he had a sleep study done no OSA detected.  He has been having minimal sleepiness right now he does have some snoring still.  He has lost significant weight and I think that is probably why he has had an improvement in his sleep issues.  He is working on keeping his weight down as much as possible  ROS  General: (-) fever, (-) chills, (-) night sweats, (-) weakness Skin: (-) rashes, (-) itching,. Eyes: (-) visual changes, (-) redness, (-) itching. Nose and Sinuses: (-) nasal stuffiness or itchiness, (-) postnasal drip, (-) nosebleeds, (-) sinus trouble. Mouth and Throat: (-) sore throat, (-) hoarseness. Neck: (-) swollen glands, (-) enlarged thyroid, (-) neck pain. Respiratory: - cough, (-) bloody sputum, - shortness of breath, - wheezing. Cardiovascular: - ankle swelling, (-) chest pain. Lymphatic: (-) lymph node enlargement. Neurologic: (-) numbness, (-) tingling. Psychiatric: (-) anxiety, (-) depression   Current Medication: Outpatient Encounter Medications as of 03/05/2018  Medication Sig Note  . albuterol (PROAIR HFA) 108 (90 Base) MCG/ACT inhaler ProAir HFA 90 mcg/actuation aerosol inhaler   . colchicine 0.6 MG tablet colchicine 0.6 mg tablet   . lisinopril (PRINIVIL,ZESTRIL) 10 MG tablet Take 10 mg by mouth daily. 03/22/2016: Received from: External Pharmacy  . phentermine (ADIPEX-P) 37.5 MG tablet Take 1 tablet (37.5 mg total) by mouth daily before breakfast.   . SYMBICORT 160-4.5 MCG/ACT inhaler Inhale 1 puff into the lungs 2 (two) times daily as needed. 03/22/2016: Received from: External Pharmacy   No facility-administered  encounter medications on file as of 03/05/2018.     Surgical History: Past Surgical History:  Procedure Laterality Date  . ELBOW FRACTURE SURGERY Right    Approx age 89  . LAPAROSCOPIC GASTRIC SLEEVE RESECTION      Medical History: Past Medical History:  Diagnosis Date  . Asthma   . Hypertension   . Sleep apnea     Family History: Family History  Problem Relation Age of Onset  . Hypertension Father     Social History: Social History   Socioeconomic History  . Marital status: Married    Spouse name: Not on file  . Number of children: Not on file  . Years of education: Not on file  . Highest education level: Not on file  Occupational History  . Not on file  Social Needs  . Financial resource strain: Not on file  . Food insecurity:    Worry: Not on file    Inability: Not on file  . Transportation needs:    Medical: Not on file    Non-medical: Not on file  Tobacco Use  . Smoking status: Never Smoker  . Smokeless tobacco: Never Used  Substance and Sexual Activity  . Alcohol use: Yes    Comment: OCCASIONALLY  . Drug use: No  . Sexual activity: Yes    Partners: Female  Lifestyle  . Physical activity:    Days per week: Not on file    Minutes per session: Not on file  . Stress: Not on file  Relationships  . Social connections:    Talks on phone: Not on file    Gets together: Not on file  Attends religious service: Not on file    Active member of club or organization: Not on file    Attends meetings of clubs or organizations: Not on file    Relationship status: Not on file  . Intimate partner violence:    Fear of current or ex partner: Not on file    Emotionally abused: Not on file    Physically abused: Not on file    Forced sexual activity: Not on file  Other Topics Concern  . Not on file  Social History Narrative  . Not on file    Vital Signs: Blood pressure 140/80, pulse 77, resp. rate 16, height 6\' 2"  (1.88 m), weight 268 lb (121.6 kg), SpO2 98  %.  Examination: General Appearance: The patient is well-developed, well-nourished, and in no distress. Skin: Gross inspection of skin unremarkable. Head: normocephalic, no gross deformities. Eyes: no gross deformities noted. ENT: ears appear grossly normal no exudates. Neck: Supple. No thyromegaly. No LAD. Respiratory: no rhonchi noted. Cardiovascular: Normal S1 and S2 without murmur or rub. Extremities: No cyanosis. pulses are equal. Neurologic: Alert and oriented. No involuntary movements.  LABS: No results found for this or any previous visit (from the past 2160 hour(s)).  Radiology: Nm Myocar Multi W/spect W/wall Motion / Ef  Result Date: 03/29/2016 Pharmacological myocardial perfusion imaging study with no significant  ischemia Normal wall motion, EF estimated at 45% No EKG changes concerning for ischemia at peak stress or in recovery. Low risk scan Signed, Dossie Arbourim Gollan, MD, Ph.D [facility]    No results found.  No results found.    Assessment and Plan: Patient Active Problem List   Diagnosis Date Noted  . Moderate asthma without complication 12/15/2017  . Essential hypertension 02/10/2016  . Heartburn 02/10/2016  . Moderate obesity 02/10/2016  . Obstructive sleep apnea syndrome 02/10/2016    1. OSA resolved I think at this time he is doing better does not need to be using the CPAP device based on his sleep study results. 2. Mild Asthma clinically stable we will continue with present management. 3. Morbid obesity needs to continue working on his weight loss 4. GERD compensated no active symptoms no reflux noted  General Counseling: I have discussed the findings of the evaluation and examination with Luis Hendrix.  I have also discussed any further diagnostic evaluation thatmay be needed or ordered today. Luis Hendrix verbalizes understanding of the findings of todays visit. We also reviewed his medications today and discussed drug interactions and side effects including but  not limited excessive drowsiness and altered mental states. We also discussed that there is always a risk not just to him but also people around him. he has been encouraged to call the office with any questions or concerns that should arise related to todays visit.    Time spent: 15min  I have personally obtained a history, examined the patient, evaluated laboratory and imaging results, formulated the assessment and plan and placed orders.    Yevonne PaxSaadat A Alexios Keown, MD [facility] Pul[facility] and Critical [facility] Sleep medicine

## 2018-04-07 ENCOUNTER — Emergency Department
Admission: EM | Admit: 2018-04-07 | Discharge: 2018-04-07 | Disposition: A | Discharge status: Home / Self Care | Payer: BC Managed Care – PPO | Attending: Emergency Medicine | Admitting: Emergency Medicine

## 2018-04-07 ENCOUNTER — Other Ambulatory Visit: Payer: Self-pay

## 2018-04-07 DIAGNOSIS — Z79899 Other long term (current) drug therapy: Secondary | ICD-10-CM | POA: Diagnosis not present

## 2018-04-07 DIAGNOSIS — K5900 Constipation, unspecified: Secondary | ICD-10-CM | POA: Diagnosis not present

## 2018-04-07 DIAGNOSIS — R1031 Right lower quadrant pain: Secondary | ICD-10-CM | POA: Diagnosis present

## 2018-04-07 DIAGNOSIS — J45909 Unspecified asthma, uncomplicated: Secondary | ICD-10-CM | POA: Diagnosis not present

## 2018-04-07 DIAGNOSIS — I10 Essential (primary) hypertension: Secondary | ICD-10-CM | POA: Diagnosis not present

## 2018-04-07 LAB — URINALYSIS, COMPLETE (UACMP) WITH MICROSCOPIC
Bacteria, UA: NONE SEEN
Bilirubin Urine: NEGATIVE
Glucose, UA: NEGATIVE mg/dL
Hgb urine dipstick: NEGATIVE
Ketones, ur: 20 mg/dL — AB
Leukocytes, UA: NEGATIVE
Nitrite: NEGATIVE
Protein, ur: NEGATIVE mg/dL
Specific Gravity, Urine: 1.017 (ref 1.005–1.030)
Squamous Epithelial / LPF: NONE SEEN (ref 0–5)
pH: 5 (ref 5.0–8.0)

## 2018-04-07 LAB — COMPREHENSIVE METABOLIC PANEL
ALT: 22 U/L (ref 0–44)
AST: 27 U/L (ref 15–41)
Albumin: 4.9 g/dL (ref 3.5–5.0)
Alkaline Phosphatase: 60 U/L (ref 38–126)
Anion gap: 8 (ref 5–15)
BUN: 13 mg/dL (ref 6–20)
CO2: 28 mmol/L (ref 22–32)
Calcium: 9.5 mg/dL (ref 8.9–10.3)
Chloride: 101 mmol/L (ref 98–111)
Creatinine, Ser: 0.99 mg/dL (ref 0.61–1.24)
GFR calc Af Amer: >60 mL/min (ref >60)
GFR calc non Af Amer: >60 mL/min (ref >60)
Glucose, Bld: 87 mg/dL (ref 70–99)
Potassium: 4.2 mmol/L (ref 3.5–5.1)
Sodium: 137 mmol/L (ref 135–145)
Total Bilirubin: 1.2 mg/dL (ref 0.3–1.2)
Total Protein: 7.8 g/dL (ref 6.5–8.1)

## 2018-04-07 LAB — CBC
HCT: 45.5 % (ref 40.0–52.0)
Hemoglobin: 15 g/dL (ref 13.0–18.0)
MCH: 27.3 pg (ref 26.0–34.0)
MCHC: 32.9 g/dL (ref 32.0–36.0)
MCV: 83 fL (ref 80.0–100.0)
Platelets: 208 10*3/uL (ref 150–440)
RBC: 5.48 MIL/uL (ref 4.40–5.90)
RDW: 13.5 % (ref 11.5–14.5)
WBC: 5.3 10*3/uL (ref 3.8–10.6)

## 2018-04-07 LAB — LIPASE, BLOOD: Lipase: 34 U/L (ref 11–51)

## 2018-04-07 NOTE — Discharge Instructions (Addendum)
Fortunately your symptoms resolved after having several bowel movements. Please follow up with your doctor as needed. It may be helpful to take a daily stool softener such as docusate to prevent constipation in the future.

## 2018-04-07 NOTE — ED Provider Notes (Signed)
[facility] Emergency Department Provider Note  ____________________________________________  Time seen: Approximately 4:17 PM  I have reviewed the triage vital signs and the nursing notes.   HISTORY  Chief Complaint Abdominal Pain and Back Pain    HPI Luis Hendrix. is a 52 y.o. male with a history of hypertension and asthma who was sent to the ED today from walk-in clinic with concerns of fecal impaction, obstruction, or may be appendicitis.  Patient reports that he been having right lower quadrant abdominal pain radiating to the back, moderate intensity, constant since yesterday.  Waxing and waning.  No aggravating or alleviating factors.  Last bowel movement was yesterday morning felt hard.  No vomiting, no fevers or chills.  No prior abdominal surgeries.  It is reported that x-ray done in walk-in clinic showed large amount of air.  Patient reports he is passing gas.  He was given a bottle of magnesium citrate while waiting in the ED waiting room.  He reports that afterward he had 4 bowel movements, passed a lot of stool, and now feels back to normal.  He reports complete resolution of his symptoms and denies any abdominal pain whatsoever.      Past Medical History:  Diagnosis Date  . Asthma   . Hypertension   . Sleep apnea      Patient Active Problem List   Diagnosis Date Noted  . Moderate asthma without complication 12/15/2017  . Essential hypertension 02/10/2016  . Heartburn 02/10/2016  . Moderate obesity 02/10/2016  . Obstructive sleep apnea syndrome 02/10/2016     Past Surgical History:  Procedure Laterality Date  . ELBOW FRACTURE SURGERY Right    Approx age 6  . LAPAROSCOPIC GASTRIC SLEEVE RESECTION       Prior to Admission medications   Medication Sig Start Date End Date Taking? Authorizing Provider  albuterol (PROAIR HFA) 108 (90 Base) MCG/ACT inhaler ProAir HFA 90 mcg/actuation aerosol inhaler    [provider]   colchicine 0.6 MG tablet colchicine 0.6 mg tablet    [provider]  lisinopril (PRINIVIL,ZESTRIL) 10 MG tablet Take 10 mg by mouth daily. 02/01/16   [provider]  phentermine (ADIPEX-P) 37.5 MG tablet Take 1 tablet (37.5 mg total) by mouth daily before breakfast. 03/02/18   Carlean Jews, NP  SYMBICORT 160-4.5 MCG/ACT inhaler Inhale 1 puff into the lungs 2 (two) times daily as needed. 12/26/15   [provider]     Allergies Patient has no known allergies.   Family History  Problem Relation Age of Onset  . Hypertension Father     Social History Social History   Tobacco Use  . Smoking status: Never Smoker  . Smokeless tobacco: Never Used  Substance Use Topics  . Alcohol use: Yes    Comment: OCCASIONALLY  . Drug use: No    Review of Systems  Constitutional:   No fever or chills.   Cardiovascular:   No chest pain or syncope. Respiratory:   No dyspnea or cough. Gastrointestinal:   Positive for abdominal pain now resolved, no vomiting and diarrhea.  Musculoskeletal:   Negative for focal pain or swelling All other systems reviewed and are negative except as documented above in ROS and HPI.  ____________________________________________   PHYSICAL EXAM:  VITAL SIGNS: ED Triage Vitals  Enc Vitals Group     BP 04/07/18 1317 137/83     Pulse Rate 04/07/18 1317 72     Resp 04/07/18 1317 18  Temp 04/07/18 1317 (!) 97.5 F (36.4 C)     Temp Source 04/07/18 1317 Oral     SpO2 04/07/18 1317 97 %     Weight 04/07/18 1317 255 lb (115.7 kg)     Height 04/07/18 1317 6\' 2"  (1.88 m)     Head Circumference --      Peak Flow --      Pain Score 04/07/18 1325 5     Pain Loc --      Pain Edu? --      Excl. in GC? --     Vital signs reviewed, nursing assessments reviewed.   Constitutional:   Alert and oriented. Non-toxic appearance. Eyes:   Conjunctivae are normal. EOMI. PERRL. ENT      Head:   Normocephalic and atraumatic.      Nose:    No congestion/rhinnorhea.       Mouth/Throat:   MMM, no pharyngeal erythema. No peritonsillar mass.       Neck:   No meningismus. Full ROM. Hematological/Lymphatic/Immunilogical:   No cervical lymphadenopathy. Cardiovascular:   RRR. Symmetric bilateral radial and DP pulses.  No murmurs. Cap refill less than 2 seconds. Respiratory:   Normal respiratory effort without tachypnea/retractions. Breath sounds are clear and equal bilaterally. No wheezes/rales/rhonchi. Gastrointestinal:   Soft and nontender. Non distended. No rebound, rigidity, or guarding.  No tenderness at McBurney's point  Musculoskeletal:   Normal range of motion in all extremities. No joint effusions.  No lower extremity tenderness.  No edema. Neurologic:   Normal speech and language.  Motor grossly intact. No acute focal neurologic deficits are appreciated.  Skin:    Skin is warm, dry and intact. No rash noted.  No petechiae, purpura, or bullae.  ____________________________________________    LABS (pertinent positives/negatives) (all labs ordered are listed, but only abnormal results are displayed) Labs Reviewed  URINALYSIS, COMPLETE (UACMP) WITH MICROSCOPIC - Abnormal; Notable for the following components:      Result Value   Color, Urine YELLOW (*)    APPearance CLEAR (*)    Ketones, ur 20 (*)    All other components within normal limits  LIPASE, BLOOD  COMPREHENSIVE METABOLIC PANEL  CBC   ____________________________________________   EKG    ____________________________________________    RADIOLOGY  No results found.  ____________________________________________   PROCEDURES Procedures  ____________________________________________    CLINICAL IMPRESSION / ASSESSMENT AND PLAN / ED COURSE  Pertinent labs & imaging results that were available during my care of the patient were reviewed by me and considered in my medical decision making (see chart for details).    Patient sent to ED with  abdominal pain, abnormal x-ray.  However after taking a laxative and having several bowel movements he now feels that his symptoms are resolved.  Exam is completely normal.  Labs are normal with normal white blood cell count.  Vital signs are normal.  At this point I doubt perforation obstruction appendicitis ileus volvulus pneumatosis biliary disease pancreatitis or any other significant intra-abdominal pathology.  Not consistent with urinary tract infection or pyelonephritis.  Suitable for outpatient management.  Recommended daily stool softener such as Colace      ____________________________________________   FINAL CLINICAL IMPRESSION(S) / ED DIAGNOSES    Final diagnoses:  Right lower quadrant abdominal pain  Constipation, unspecified constipation type     ED Discharge Orders    None      Portions of this note were generated with dragon dictation software. Dictation errors may occur despite  best attempts at proofreading.    Sharman CheekStafford, Aveen Stansel, MD 04/07/18 [phone]

## 2018-04-07 NOTE — ED Triage Notes (Signed)
Pt arrived via POV with reports of 1.5 days of RLQ abd pain radiating to the right back pain 5/10.  Pt states he was seen [facility] and had XR done that showed gas bubbles and impaction.  Pt states he is passing gas.   Pt states last BM was 2 small this morning. Pt states it was hard and had to strain.  Pt states last normal BM was about 2 days ago.

## 2018-04-13 ENCOUNTER — Ambulatory Visit: Payer: BC Managed Care – PPO | Admitting: Nurse Practitioner

## 2018-04-13 ENCOUNTER — Encounter: Payer: Self-pay | Admitting: Nurse Practitioner

## 2018-04-13 VITALS — BP 113/78 | HR 80 | Resp 16 | Ht 74.0 in | Wt 258.0 lb

## 2018-04-13 DIAGNOSIS — J45909 Unspecified asthma, uncomplicated: Secondary | ICD-10-CM | POA: Diagnosis not present

## 2018-04-13 DIAGNOSIS — E668 Other obesity: Secondary | ICD-10-CM

## 2018-04-13 MED ORDER — PHENTERMINE HCL 37.5 MG PO TABS: 37.5000 mg | ORAL_TABLET | Freq: Every day | ORAL | 1 refills | Status: DC

## 2018-04-13 MED ORDER — SYMBICORT 160-4.5 MCG/ACT IN AERO: 1.0000 | INHALATION_SPRAY | Freq: Two times a day (BID) | RESPIRATORY_TRACT | 11 refills | Status: DC | PRN

## 2018-04-13 NOTE — Progress Notes (Signed)
[facility] [address] [place], [place] [address]  Internal MEDICINE  Office Visit Note  Patient Name: Luis Hendrix  [id number]  [id number]  Date of Service: 04/13/2018  Chief Complaint  Patient presents with  . Hypertension  . Medical Management of Chronic Issues    Weight management    Patient arrives to primary care to follow up with his weight loss treatment. Patient started on phentermine on 03/02/18 and lost about 10 pounds since then. Patient feels well. Vital signs are stable. Patient does not have any current complaints or concerns. He decreased the amount of carbohydrate intake and increased his protein intake. Patient tries to stay around 1500 calories a day intake. Patient continues to exercises 4 times a week, runs about 3 miles on average.  Patient had been seen in ED 6 days ago due to constipation. Currently, the issue got resolved and patient denies any abdominal pain or problem with bowel movements. Patient started taking mag citrate as laxative as needed. Otherwise, patient does not have any questions or concerns at this time.      Current Medication: Outpatient Encounter Medications as of 04/13/2018  Medication Sig Note  . albuterol (PROAIR HFA) 108 (90 Base) MCG/ACT inhaler ProAir HFA 90 mcg/actuation aerosol inhaler   . colchicine 0.6 MG tablet colchicine 0.6 mg tablet   . lisinopril (PRINIVIL,ZESTRIL) 10 MG tablet Take 10 mg by mouth daily. 03/22/2016: Received from: External Pharmacy  . phentermine (ADIPEX-P) 37.5 MG tablet Take 1 tablet (37.5 mg total) by mouth daily before breakfast.   . SYMBICORT 160-4.5 MCG/ACT inhaler Inhale 1 puff into the lungs 2 (two) times daily as needed.   . [DISCONTINUED] phentermine (ADIPEX-P) 37.5 MG tablet Take 1 tablet (37.5 mg total) by mouth daily before breakfast.   . [DISCONTINUED] SYMBICORT 160-4.5 MCG/ACT inhaler Inhale 1 puff into the lungs 2 (two) times daily as needed. 03/22/2016: Received from:  External Pharmacy   No facility-administered encounter medications on file as of 04/13/2018.     Surgical History: Past Surgical History:  Procedure Laterality Date  . ELBOW FRACTURE SURGERY Right    Approx age 6  . LAPAROSCOPIC GASTRIC SLEEVE RESECTION      Medical History: Past Medical History:  Diagnosis Date  . Asthma   . Hypertension   . Sleep apnea     Family History: Family History  Problem Relation Age of Onset  . Hypertension Father     Social History   Socioeconomic History  . Marital status: Married    Spouse name: Not on file  . Number of children: Not on file  . Years of education: Not on file  . Highest education level: Not on file  Occupational History  . Not on file  Social Needs  . Financial resource strain: Not on file  . Food insecurity:    Worry: Not on file    Inability: Not on file  . Transportation needs:    Medical: Not on file    Non-medical: Not on file  Tobacco Use  . Smoking status: Never Smoker  . Smokeless tobacco: Never Used  Substance and Sexual Activity  . Alcohol use: Yes    Comment: OCCASIONALLY  . Drug use: No  . Sexual activity: Yes    Partners: Female  Lifestyle  . Physical activity:    Days per week: Not on file    Minutes per session: Not on file  . Stress: Not on file  Relationships  . Social connections:  Talks on phone: Not on file    Gets together: Not on file    Attends religious service: Not on file    Active member of club or organization: Not on file    Attends meetings of clubs or organizations: Not on file    Relationship status: Not on file  . Intimate partner violence:    Fear of current or ex partner: Not on file    Emotionally abused: Not on file    Physically abused: Not on file    Forced sexual activity: Not on file  Other Topics Concern  . Not on file  Social History Narrative  . Not on file      Review of Systems  Constitutional: Negative for activity change, appetite change,  fatigue, fever and unexpected weight change.       Weight loss 10 pounds since lest visit.   HENT: Negative for congestion, facial swelling, hearing loss, postnasal drip, sinus pressure, sore throat and voice change.   Eyes: Negative.   Respiratory: Negative for apnea, cough, choking, chest tightness, shortness of breath and wheezing.   Cardiovascular: Negative for chest pain, palpitations and leg swelling.  Gastrointestinal: Positive for constipation. Negative for abdominal distention, abdominal pain, anal bleeding, blood in stool, diarrhea, nausea and vomiting.       Resolved  Endocrine: Negative for cold intolerance, heat intolerance, polydipsia, polyphagia and polyuria.  Genitourinary: Negative.  Negative for difficulty urinating, dysuria, flank pain, frequency, genital sores, hematuria and urgency.  Musculoskeletal: Negative for arthralgias, back pain, gait problem and neck stiffness.  Skin: Negative for color change, pallor, rash and wound.  Allergic/Immunologic: Negative for environmental allergies.  Neurological: Negative for dizziness, tremors, facial asymmetry, light-headedness, numbness and headaches.  Hematological: Negative for adenopathy.  Psychiatric/Behavioral: Negative for agitation, dysphoric mood and suicidal ideas. The patient is not nervous/anxious.     Vital Signs: BP 113/78   Pulse 80   Resp 16   Ht 6\' 2"  (1.88 m)   Wt 258 lb (117 kg)   SpO2 99%   BMI 33.13 kg/m    Physical Exam  Constitutional: He is oriented to person, place, and time. He appears well-developed and well-nourished.  HENT:  Head: Normocephalic and atraumatic.  Right Ear: External ear normal.  Left Ear: External ear normal.  Nose: Nose normal.  Mouth/Throat: Oropharynx is clear and moist.  Eyes: Pupils are equal, round, and reactive to light. Conjunctivae and EOM are normal.  Neck: Normal range of motion. Neck supple. No JVD present. No tracheal deviation present. No thyromegaly present.   Cardiovascular: Normal rate, regular rhythm, normal heart sounds and intact distal pulses.  Pulmonary/Chest: Effort normal and breath sounds normal. No stridor. No respiratory distress. He has no wheezes. He has no rales. He exhibits no tenderness.  Abdominal: Soft. Bowel sounds are normal. He exhibits no distension and no mass. There is no tenderness. There is no guarding.  Musculoskeletal: Normal range of motion.  Lymphadenopathy:    He has no cervical adenopathy.  Neurological: He is alert and oriented to person, place, and time.  Skin: Skin is warm and dry.  Psychiatric: He has a normal mood and affect.  Nursing note and vitals reviewed.  Assessment/Plan: 1. Moderate asthma without complication, unspecified whether persistent Currently stable. Continue symbicort twice daily. Use rescue inhaler as needed and as prescribed.  - SYMBICORT 160-4.5 MCG/ACT inhaler; Inhale 1 puff into the lungs 2 (two) times daily as needed.  Dispense: 1 Inhaler; Refill: 11  2. Moderate  obesity Improved. May continue phentermine daily. Plan to wean off this medication and continue with 1500 calorie diet and current exercise regimen.  - phentermine (ADIPEX-P) 37.5 MG tablet; Take 1 tablet (37.5 mg total) by mouth daily before breakfast.  Dispense: 30 tablet; Refill: 1  General Counseling: Kaulin verbalizes understanding of the findings of todays visit and agrees with plan of treatment. I have discussed any further diagnostic evaluation that may be needed or ordered today. We also reviewed his medications today. he has been encouraged to call the office with any questions or concerns that should arise related to todays visit.    There is a liability release in patients' chart. There has been a 10 minute discussion about the side effects including but not limited to elevated blood pressure, anxiety, lack of sleep and dry mouth. Pt understands and will like to start/continue on appetite suppressant at this  time. There will be one month RX given at the time of visit with proper follow up. Nova diet plan with restricted calories is given to the pt. Pt understands and agrees with  plan of treatment  This patient was seen by Vincent Gros FNP Collaboration with Dr Lyndon Code as a part of collaborative care agreement   Meds ordered this encounter  Medications  . SYMBICORT 160-4.5 MCG/ACT inhaler    Sig: Inhale 1 puff into the lungs 2 (two) times daily as needed.    Dispense:  1 Inhaler    Refill:  11    Order Specific Question:   Supervising Provider    Answer:   Lyndon Code [[id number]]  . phentermine (ADIPEX-P) 37.5 MG tablet    Sig: Take 1 tablet (37.5 mg total) by mouth daily before breakfast.    Dispense:  30 tablet    Refill:  1    Order Specific Question:   Supervising Provider    Answer:   Lyndon Code [[id number]]    Time spent: 70 Minutes      Dr Lyndon Code Internal medicine

## 2018-05-06 ENCOUNTER — Other Ambulatory Visit: Payer: Self-pay | Admitting: Internal Medicine

## 2018-10-12 ENCOUNTER — Ambulatory Visit: Payer: Self-pay | Admitting: Nurse Practitioner

## 2018-12-13 ENCOUNTER — Ambulatory Visit: Payer: BC Managed Care – PPO | Admitting: Nurse Practitioner

## 2018-12-13 ENCOUNTER — Other Ambulatory Visit: Payer: Self-pay

## 2018-12-13 ENCOUNTER — Encounter: Payer: Self-pay | Admitting: Nurse Practitioner

## 2018-12-13 VITALS — Ht 73.0 in | Wt 260.0 lb

## 2018-12-13 DIAGNOSIS — J45909 Unspecified asthma, uncomplicated: Secondary | ICD-10-CM

## 2018-12-13 DIAGNOSIS — E668 Other obesity: Secondary | ICD-10-CM

## 2018-12-13 DIAGNOSIS — I10 Essential (primary) hypertension: Secondary | ICD-10-CM

## 2018-12-13 MED ORDER — PHENTERMINE HCL 37.5 MG PO TABS: 37.5000 mg | ORAL_TABLET | Freq: Every day | ORAL | 1 refills | Status: DC

## 2018-12-13 NOTE — Progress Notes (Signed)
[facility] [address] [place], [place] [address]  Internal MEDICINE  Telephone Visit  Patient Name: Luis Hendrix  [id number]  [id number]  Date of Service: 12/13/2018  I connected with the patient at 8:52am by telephone and verified the patients identity using two identifiers.   I discussed the limitations, risks, security and privacy concerns of performing an evaluation and management service by telephone and the availability of in person appointments. I also discussed with the patient that there may be a patient responsible charge related to the service.  The patient expressed understanding and agrees to proceed.    Chief Complaint  Patient presents with  . Telephone Screen    VIDEO VISIT [phone]  . Telephone Assessment  . Medical Management of Chronic Issues    6 month follow up  . Hypertension  . Asthma    The patient has been contacted via telephone for follow up visit due to concerns for spread of novel coronavirus. Blood pressure has remained stable. Will have it checked per RN at his office. Asthma is stable. Is having to use rescue inhaler a little bit when the weather changes and there is pollen in the air. He states that he is frustrated with lack of exercise he wants to do. Gyms still closed based on fear of spread of COVID 19. Has started gaining some of his weight back. Is eating more being at home much more often. Would like to restart phentermine for short time to help control appetite and help him with weight loss.       Current Medication: Outpatient Encounter Medications as of 12/13/2018  Medication Sig  . albuterol (PROAIR HFA) 108 (90 Base) MCG/ACT inhaler ProAir HFA 90 mcg/actuation aerosol inhaler  . budesonide-formoterol (SYMBICORT) 160-4.5 MCG/ACT inhaler Symbicort 160 mcg-4.5 mcg/actuation HFA aerosol inhaler  . colchicine 0.6 MG tablet colchicine 0.6 mg tablet  . indomethacin (INDOCIN) 50 MG capsule indomethacin 50 mg capsule   . lisinopril (PRINIVIL,ZESTRIL) 10 MG tablet TAKE 1 TABLET BY MOUTH ONCE DAILY FOR BLOOD PRESSURE  . ondansetron (ZOFRAN-ODT) 4 MG disintegrating tablet ondansetron 4 mg disintegrating tablet  . phentermine (ADIPEX-P) 37.5 MG tablet Take 1 tablet (37.5 mg total) by mouth daily before breakfast.  . SYMBICORT 160-4.5 MCG/ACT inhaler Inhale 1 puff into the lungs 2 (two) times daily as needed.  . [DISCONTINUED] phentermine (ADIPEX-P) 37.5 MG tablet Take 1 tablet (37.5 mg total) by mouth daily before breakfast.   No facility-administered encounter medications on file as of 12/13/2018.     Surgical History: Past Surgical History:  Procedure Laterality Date  . ELBOW FRACTURE SURGERY Right    Approx age 71  . LAPAROSCOPIC GASTRIC SLEEVE RESECTION      Medical History: Past Medical History:  Diagnosis Date  . Asthma   . Hypertension   . Sleep apnea     Family History: Family History  Problem Relation Age of Onset  . Hypertension Father     Social History   Socioeconomic History  . Marital status: Married    Spouse name: Not on file  . Number of children: Not on file  . Years of education: Not on file  . Highest education level: Not on file  Occupational History  . Not on file  Social Needs  . Financial resource strain: Not on file  . Food insecurity:    Worry: Not on file    Inability: Not on file  . Transportation needs:    Medical: Not on file  Non-medical: Not on file  Tobacco Use  . Smoking status: Never Smoker  . Smokeless tobacco: Never Used  Substance and Sexual Activity  . Alcohol use: Yes    Comment: OCCASIONALLY  . Drug use: No  . Sexual activity: Yes    Partners: Female  Lifestyle  . Physical activity:    Days per week: Not on file    Minutes per session: Not on file  . Stress: Not on file  Relationships  . Social connections:    Talks on phone: Not on file    Gets together: Not on file    Attends religious service: Not on file    Active member  of club or organization: Not on file    Attends meetings of clubs or organizations: Not on file    Relationship status: Not on file  . Intimate partner violence:    Fear of current or ex partner: Not on file    Emotionally abused: Not on file    Physically abused: Not on file    Forced sexual activity: Not on file  Other Topics Concern  . Not on file  Social History Narrative  . Not on file      Review of Systems  Constitutional: Positive for unexpected weight change. Negative for activity change, appetite change, fatigue and fever.       Undesired weight gain with COVID 19 quarantine.  HENT: Negative for congestion, facial swelling, hearing loss, postnasal drip, sinus pressure, sore throat and voice change.   Eyes: Negative.   Respiratory: Negative for apnea, cough, choking, chest tightness, shortness of breath and wheezing.   Cardiovascular: Negative for chest pain, palpitations and leg swelling.  Gastrointestinal: Negative for abdominal distention, abdominal pain, anal bleeding, blood in stool, constipation, diarrhea, nausea and vomiting.       Resolved  Endocrine: Negative for cold intolerance, heat intolerance, polydipsia and polyuria.  Genitourinary: Negative.   Musculoskeletal: Negative for arthralgias, back pain, gait problem and neck stiffness.  Skin: Negative for color change, pallor, rash and wound.  Allergic/Immunologic: Positive for environmental allergies.  Neurological: Negative for dizziness, tremors, facial asymmetry, light-headedness, numbness and headaches.  Hematological: Negative for adenopathy.  Psychiatric/Behavioral: Negative for agitation, dysphoric mood and suicidal ideas. The patient is not nervous/anxious.     Today's Vitals   12/13/18 0930  Weight: 260 lb (117.9 kg)  Height:  (1.854 m)   Body mass index is 34.3 kg/m.  Observation/Objective:  The patient is alert and oriented. He is pleasant and answering all questions appropriately.  Breathing is non-labored. He is in no acute distress.    Assessment/Plan: 1. Essential hypertension Stable. Continue bp medication as prescribed   2. Moderate asthma without complication, unspecified whether persistent Continue with symbicort twice daily and use rescue inhaler as needed and as prescribed   3. Moderate obesity Restart phentermine. Limit calorie intake to 1500 calories per day. incorporate exercise into daily life when possible.  - phentermine (ADIPEX-P) 37.5 MG tablet; Take 1 tablet (37.5 mg total) by mouth daily before breakfast.  Dispense: 30 tablet; Refill: 1  General Counseling: rodolphe edmonston understanding of the findings of today's phone visit and agrees with plan of treatment. I have discussed any further diagnostic evaluation that may be needed or ordered today. We also reviewed his medications today. he has been encouraged to call the office with any questions or concerns that should arise related to todays visit.   There is a liability release in patients' chart. There has  been a 10 minute discussion about the side effects including but not limited to elevated blood pressure, anxiety, lack of sleep and dry mouth. Pt understands and will like to start/continue on appetite suppressant at this time. There will be one month RX given at the time of visit with proper follow up. Nova diet plan with restricted calories is given to the pt. Pt understands and agrees with  plan of treatment  This patient was seen by Vincent GrosHeather Lacrisha Bielicki FNP Collaboration with Dr Lyndon CodeFozia M Khan as a part of collaborative care agreement  Meds ordered this encounter  Medications  . phentermine (ADIPEX-P) 37.5 MG tablet    Sig: Take 1 tablet (37.5 mg total) by mouth daily before breakfast.    Dispense:  30 tablet    Refill:  1    Order Specific Question:   Supervising Provider    Answer:   Lyndon CodeKHAN, FOZIA M [[id number]]    Time spent: 8125 Minutes    Dr Lyndon CodeFozia M Khan Internal medicine

## 2019-01-14 ENCOUNTER — Ambulatory Visit: Payer: BC Managed Care – PPO | Admitting: Nurse Practitioner

## 2019-01-15 ENCOUNTER — Other Ambulatory Visit: Payer: Self-pay

## 2019-01-15 ENCOUNTER — Ambulatory Visit: Payer: BC Managed Care – PPO | Admitting: Nurse Practitioner

## 2019-01-15 ENCOUNTER — Encounter: Payer: Self-pay | Admitting: Nurse Practitioner

## 2019-01-15 VITALS — BP 127/89 | HR 87 | Resp 16 | Ht 73.0 in | Wt 265.3 lb

## 2019-01-15 DIAGNOSIS — I10 Essential (primary) hypertension: Secondary | ICD-10-CM

## 2019-01-15 DIAGNOSIS — J45909 Unspecified asthma, uncomplicated: Secondary | ICD-10-CM | POA: Diagnosis not present

## 2019-01-15 DIAGNOSIS — E668 Other obesity: Secondary | ICD-10-CM

## 2019-01-15 MED ORDER — PHENTERMINE HCL 37.5 MG PO TABS: 37.5000 mg | ORAL_TABLET | Freq: Every day | ORAL | 1 refills | Status: DC

## 2019-01-15 NOTE — Progress Notes (Signed)
[facility] [address] [place], [place] [address]  Internal MEDICINE  Office Visit Note  Patient Name: Luis PieriniGeorge L Macnair Jr.  [id number]  [id number]  Date of Service: 01/15/2019  Chief Complaint  Patient presents with  . Follow-up    weight management  . Hypertension    Blood pressure has remained stable. Asthma is stable. Is having to use rescue inhaler a little bit when the weather changes and there is pollen in the air. States that he uses it about two times per week. He states that he is frustrated with lack of exercise he wants to do. Gyms still closed based on fear of spread of COVID 19. Has started gaining some of his weight back. Is eating more being at home much more often. Would like to restart phentermine for short time to help control appetite and help him with weight loss. Restarted phentermine at his last visit. Due to lack of exercise, he is continuing to gain weight, in fact, he had five pound weight gain since his last visit.  Made him aware of this. Understands he needs to exercise more. States that he is eating and sleeping well.        Current Medication: Outpatient Encounter Medications as of 01/15/2019  Medication Sig  . albuterol (PROAIR HFA) 108 (90 Base) MCG/ACT inhaler ProAir HFA 90 mcg/actuation aerosol inhaler  . budesonide-formoterol (SYMBICORT) 160-4.5 MCG/ACT inhaler Symbicort 160 mcg-4.5 mcg/actuation HFA aerosol inhaler  . colchicine 0.6 MG tablet colchicine 0.6 mg tablet  . indomethacin (INDOCIN) 50 MG capsule indomethacin 50 mg capsule  . lisinopril (PRINIVIL,ZESTRIL) 10 MG tablet TAKE 1 TABLET BY MOUTH ONCE DAILY FOR BLOOD PRESSURE  . ondansetron (ZOFRAN-ODT) 4 MG disintegrating tablet ondansetron 4 mg disintegrating tablet  . phentermine (ADIPEX-P) 37.5 MG tablet Take 1 tablet (37.5 mg total) by mouth daily before breakfast.  . SYMBICORT 160-4.5 MCG/ACT inhaler Inhale 1 puff into the lungs 2 (two) times daily as needed.  .  [DISCONTINUED] phentermine (ADIPEX-P) 37.5 MG tablet Take 1 tablet (37.5 mg total) by mouth daily before breakfast.   No facility-administered encounter medications on file as of 01/15/2019.     Surgical History: Past Surgical History:  Procedure Laterality Date  . ELBOW FRACTURE SURGERY Right    Approx age 89  . LAPAROSCOPIC GASTRIC SLEEVE RESECTION      Medical History: Past Medical History:  Diagnosis Date  . Asthma   . Hypertension   . Sleep apnea     Family History: Family History  Problem Relation Age of Onset  . Hypertension Father     Social History   Socioeconomic History  . Marital status: Married    Spouse name: Not on file  . Number of children: Not on file  . Years of education: Not on file  . Highest education level: Not on file  Occupational History  . Not on file  Social Needs  . Financial resource strain: Not on file  . Food insecurity    Worry: Not on file    Inability: Not on file  . Transportation needs    Medical: Not on file    Non-medical: Not on file  Tobacco Use  . Smoking status: Never Smoker  . Smokeless tobacco: Never Used  Substance and Sexual Activity  . Alcohol use: Yes    Comment: OCCASIONALLY  . Drug use: No  . Sexual activity: Yes    Partners: Female  Lifestyle  . Physical activity    Days per week:  Not on file    Minutes per session: Not on file  . Stress: Not on file  Relationships  . Social Herbalist on phone: Not on file    Gets together: Not on file    Attends religious service: Not on file    Active member of club or organization: Not on file    Attends meetings of clubs or organizations: Not on file    Relationship status: Not on file  . Intimate partner violence    Fear of current or ex partner: Not on file    Emotionally abused: Not on file    Physically abused: Not on file    Forced sexual activity: Not on file  Other Topics Concern  . Not on file  Social History Narrative  . Not on file       Review of Systems  Constitutional: Positive for unexpected weight change. Negative for activity change, appetite change, fatigue and fever.       Five pound weight gain since last visit   HENT: Negative for congestion, facial swelling, hearing loss, postnasal drip, sinus pressure, sore throat and voice change.   Eyes: Negative.   Respiratory: Negative for apnea, cough, choking, chest tightness, shortness of breath and wheezing.   Cardiovascular: Negative for chest pain, palpitations and leg swelling.  Gastrointestinal: Negative for abdominal distention, abdominal pain, anal bleeding, blood in stool, constipation, diarrhea, nausea and vomiting.  Endocrine: Negative for cold intolerance, heat intolerance, polydipsia and polyuria.  Genitourinary: Negative.   Musculoskeletal: Negative for arthralgias, back pain, gait problem and neck stiffness.  Skin: Negative for color change, pallor, rash and wound.  Allergic/Immunologic: Positive for environmental allergies.  Neurological: Negative for dizziness, tremors, facial asymmetry, light-headedness, numbness and headaches.  Hematological: Negative for adenopathy.  Psychiatric/Behavioral: Negative for agitation, dysphoric mood and suicidal ideas. The patient is not nervous/anxious.     Today's Vitals   01/15/19 0907  BP: 127/89  Pulse: 87  Resp: 16  SpO2: 100%  Weight: 265 lb 4.8 oz (120.3 kg)  Height: 6\' 1"  (1.854 m)   Body mass index is 35 kg/m.  Physical Exam Vitals signs and nursing note reviewed.  Constitutional:      Appearance: Normal appearance. He is well-developed.  HENT:     Head: Normocephalic and atraumatic.  Eyes:     Conjunctiva/sclera: Conjunctivae normal.     Pupils: Pupils are equal, round, and reactive to light.  Neck:     Musculoskeletal: Normal range of motion and neck supple.     Thyroid: No thyromegaly.     Vascular: No JVD.     Trachea: No tracheal deviation.  Cardiovascular:     Rate and Rhythm:  Normal rate and regular rhythm.     Heart sounds: Normal heart sounds.  Pulmonary:     Effort: Pulmonary effort is normal. No respiratory distress.     Breath sounds: Normal breath sounds. No stridor. No wheezing or rales.  Chest:     Chest wall: No tenderness.  Abdominal:     General: There is no distension.     Palpations: Abdomen is soft. There is no mass.     Tenderness: There is no abdominal tenderness. There is no guarding.  Musculoskeletal: Normal range of motion.  Lymphadenopathy:     Cervical: No cervical adenopathy.  Skin:    General: Skin is warm and dry.  Neurological:     Mental Status: He is alert and oriented to person, place, and  time.  Psychiatric:        Mood and Affect: Mood normal.        Behavior: Behavior normal.        Thought Content: Thought content normal.        Judgment: Judgment normal.     Assessment/Plan: 1. Essential hypertension Stable. Continue bp medication as prescribed   2. Moderate obesity Will continue phentermine for 30 days. Recommend 1500 calorie diet and incorporation of exercise into daily routine. Advised we would discontiue if no improvement at next visit.  - phentermine (ADIPEX-P) 37.5 MG tablet; Take 1 tablet (37.5 mg total) by mouth daily before breakfast.  Dispense: 30 tablet; Refill: 1  3. Moderate asthma without complication, unspecified whether persistent Continue to use inhalers and respiratory medications as prescribed   General Counseling: Luis PeaksGeorge verbalizes understanding of the findings of todays visit and agrees with plan of treatment. I have discussed any further diagnostic evaluation that may be needed or ordered today. We also reviewed his medications today. he has been encouraged to call the office with any questions or concerns that should arise related to todays visit.   There is a liability release in patients' chart. There has been a 10 minute discussion about the side effects including but not limited to elevated  blood pressure, anxiety, lack of sleep and dry mouth. Pt understands and will like to start/continue on appetite suppressant at this time. There will be one month RX given at the time of visit with proper follow up. Nova diet plan with restricted calories is given to the pt. Pt understands and agrees with  plan of treatment  This patient was seen by Vincent GrosHeather Kjuan Seipp FNP Collaboration with Dr Lyndon CodeFozia M Khan as a part of collaborative care agreement  Meds ordered this encounter  Medications  . phentermine (ADIPEX-P) 37.5 MG tablet    Sig: Take 1 tablet (37.5 mg total) by mouth daily before breakfast.    Dispense:  30 tablet    Refill:  1    Order Specific Question:   Supervising Provider    Answer:   Lyndon CodeKHAN, FOZIA M [[id number]]    Time spent: 7815 Minutes      Dr Lyndon CodeFozia M Khan Internal medicine

## 2019-02-11 ENCOUNTER — Encounter: Payer: Self-pay | Admitting: Nurse Practitioner

## 2019-02-11 ENCOUNTER — Other Ambulatory Visit: Payer: Self-pay

## 2019-02-11 ENCOUNTER — Ambulatory Visit: Payer: BC Managed Care – PPO | Admitting: Nurse Practitioner

## 2019-02-11 VITALS — BP 125/83 | HR 86 | Resp 16 | Ht 73.0 in | Wt 264.4 lb

## 2019-02-11 DIAGNOSIS — I10 Essential (primary) hypertension: Secondary | ICD-10-CM

## 2019-02-11 DIAGNOSIS — E668 Other obesity: Secondary | ICD-10-CM | POA: Diagnosis not present

## 2019-02-11 DIAGNOSIS — J45909 Unspecified asthma, uncomplicated: Secondary | ICD-10-CM

## 2019-02-11 MED ORDER — PHENTERMINE HCL 37.5 MG PO TABS: 37.5000 mg | ORAL_TABLET | Freq: Every day | ORAL | 1 refills | Status: DC

## 2019-02-11 NOTE — Progress Notes (Signed)
[facility] [address] [place], [place] [address]  Internal MEDICINE  Office Visit Note  Patient Name: Luis PieriniGeorge L Merkey Jr.  [id number]  [id number]  Date of Service: 02/11/2019  Chief Complaint  Patient presents with  . Medical Management of Chronic Issues    weight management  . Asthma  . Sleep Apnea  . Hypertension  . Quality Metric Gaps    colonoscopy    The patient is here for follow up weight management. He is currently taking phentermine to help control appetite. He is using his bicycle which he has been riding more often and is making better choices in regard to diet. His blood pressure is well controlled. Asthma is stable. He has no negative side effects associated with taking appetite spuuressant.       Current Medication: Outpatient Encounter Medications as of 02/11/2019  Medication Sig  . albuterol (PROAIR HFA) 108 (90 Base) MCG/ACT inhaler ProAir HFA 90 mcg/actuation aerosol inhaler  . budesonide-formoterol (SYMBICORT) 160-4.5 MCG/ACT inhaler Symbicort 160 mcg-4.5 mcg/actuation HFA aerosol inhaler  . colchicine 0.6 MG tablet colchicine 0.6 mg tablet  . indomethacin (INDOCIN) 50 MG capsule indomethacin 50 mg capsule  . lisinopril (PRINIVIL,ZESTRIL) 10 MG tablet TAKE 1 TABLET BY MOUTH ONCE DAILY FOR BLOOD PRESSURE  . ondansetron (ZOFRAN-ODT) 4 MG disintegrating tablet ondansetron 4 mg disintegrating tablet  . phentermine (ADIPEX-P) 37.5 MG tablet Take 1 tablet (37.5 mg total) by mouth daily before breakfast.  . SYMBICORT 160-4.5 MCG/ACT inhaler Inhale 1 puff into the lungs 2 (two) times daily as needed.  . [DISCONTINUED] phentermine (ADIPEX-P) 37.5 MG tablet Take 1 tablet (37.5 mg total) by mouth daily before breakfast.   No facility-administered encounter medications on file as of 02/11/2019.     Surgical History: Past Surgical History:  Procedure Laterality Date  . ELBOW FRACTURE SURGERY Right    Approx age 89  . LAPAROSCOPIC GASTRIC SLEEVE  RESECTION      Medical History: Past Medical History:  Diagnosis Date  . Asthma   . Hypertension   . Sleep apnea     Family History: Family History  Problem Relation Age of Onset  . Hypertension Father     Social History   Socioeconomic History  . Marital status: Married    Spouse name: Not on file  . Number of children: Not on file  . Years of education: Not on file  . Highest education level: Not on file  Occupational History  . Not on file  Social Needs  . Financial resource strain: Not on file  . Food insecurity    Worry: Not on file    Inability: Not on file  . Transportation needs    Medical: Not on file    Non-medical: Not on file  Tobacco Use  . Smoking status: Never Smoker  . Smokeless tobacco: Never Used  Substance and Sexual Activity  . Alcohol use: Yes    Comment: OCCASIONALLY  . Drug use: No  . Sexual activity: Yes    Partners: Female  Lifestyle  . Physical activity    Days per week: Not on file    Minutes per session: Not on file  . Stress: Not on file  Relationships  . Social Musicianconnections    Talks on phone: Not on file    Gets together: Not on file    Attends religious service: Not on file    Active member of club or organization: Not on file    Attends meetings of clubs  or organizations: Not on file    Relationship status: Not on file  . Intimate partner violence    Fear of current or ex partner: Not on file    Emotionally abused: Not on file    Physically abused: Not on file    Forced sexual activity: Not on file  Other Topics Concern  . Not on file  Social History Narrative  . Not on file      Review of Systems  Constitutional: Negative for activity change, appetite change, fatigue, fever and unexpected weight change.       Weight loss of one pound since her last visit.   HENT: Negative for congestion, facial swelling, hearing loss, postnasal drip, sinus pressure, sore throat and voice change.   Respiratory: Negative for  apnea, cough, choking, chest tightness, shortness of breath and wheezing.   Cardiovascular: Negative for chest pain and palpitations.  Gastrointestinal: Negative for abdominal distention, abdominal pain, anal bleeding, blood in stool, constipation, diarrhea and nausea.  Endocrine: Negative for cold intolerance, heat intolerance, polydipsia and polyuria.  Musculoskeletal: Negative for arthralgias, back pain, gait problem and neck stiffness.  Skin: Negative for color change, pallor, rash and wound.  Allergic/Immunologic: Positive for environmental allergies.  Neurological: Negative for dizziness, tremors, facial asymmetry, light-headedness, numbness and headaches.  Hematological: Negative for adenopathy.  Psychiatric/Behavioral: Negative for agitation, dysphoric mood and suicidal ideas. The patient is not nervous/anxious.     Today's Vitals   02/11/19 0843  BP: 125/83  Pulse: 86  Resp: 16  SpO2: 99%  Weight: 264 lb 6.4 oz (119.9 kg)  Height: 6\' 1"  (1.854 m)   Body mass index is 34.88 kg/m.  Physical Exam Vitals signs and nursing note reviewed.  Constitutional:      Appearance: Normal appearance. He is well-developed.  HENT:     Head: Normocephalic and atraumatic.  Eyes:     Conjunctiva/sclera: Conjunctivae normal.     Pupils: Pupils are equal, round, and reactive to light.  Neck:     Musculoskeletal: Normal range of motion and neck supple.     Thyroid: No thyromegaly.     Vascular: No JVD.     Trachea: No tracheal deviation.  Cardiovascular:     Rate and Rhythm: Normal rate and regular rhythm.     Heart sounds: Normal heart sounds.  Pulmonary:     Effort: Pulmonary effort is normal. No respiratory distress.     Breath sounds: Normal breath sounds. No stridor. No wheezing or rales.  Chest:     Chest wall: No tenderness.  Abdominal:     General: There is no distension.     Palpations: Abdomen is soft. There is no mass.     Tenderness: There is no abdominal tenderness.  There is no guarding.  Musculoskeletal: Normal range of motion.  Lymphadenopathy:     Cervical: No cervical adenopathy.  Skin:    General: Skin is warm and dry.  Neurological:     Mental Status: He is alert and oriented to person, place, and time.  Psychiatric:        Mood and Affect: Mood normal.        Behavior: Behavior normal.        Thought Content: Thought content normal.        Judgment: Judgment normal.    Assessment/Plan:  1. Essential hypertension Stable. Continue bp medication as prescribed.  2. Moderate asthma without complication, unspecified whether persistent Continue to use inhalers as prescribed   3. Moderate  obesity Improving. Continue phentermine 37.5mg  tablets daily. Limit calorie intake to 1200-1500 calories per day. Continue to incorporate exercise into daily routine.  - phentermine (ADIPEX-P) 37.5 MG tablet; Take 1 tablet (37.5 mg total) by mouth daily before breakfast.  Dispense: 30 tablet; Refill: 1  General Counseling: Luis Hendrix verbalizes understanding of the findings of todays visit and agrees with plan of treatment. I have discussed any further diagnostic evaluation that may be needed or ordered today. We also reviewed his medications today. he has been encouraged to call the office with any questions or concerns that should arise related to todays visit.   There is a liability release in patients' chart. There has been a 10 minute discussion about the side effects including but not limited to elevated blood pressure, anxiety, lack of sleep and dry mouth. Pt understands and will like to start/continue on appetite suppressant at this time. There will be one month RX given at the time of visit with proper follow up. Nova diet plan with restricted calories is given to the pt. Pt understands and agrees with  plan of treatment  This patient was seen by Vincent GrosHeather Cayman Kielbasa FNP Collaboration with Dr Lyndon CodeFozia M Khan as a part of collaborative care agreement  Meds ordered  this encounter  Medications  . phentermine (ADIPEX-P) 37.5 MG tablet    Sig: Take 1 tablet (37.5 mg total) by mouth daily before breakfast.    Dispense:  30 tablet    Refill:  1    Order Specific Question:   Supervising Provider    Answer:   Lyndon CodeKHAN, FOZIA M [[id number]]    Time spent: 6315 Minutes      Dr Lyndon CodeFozia M Khan Internal medicine

## 2019-03-07 ENCOUNTER — Other Ambulatory Visit: Payer: Self-pay

## 2019-03-07 ENCOUNTER — Encounter: Payer: Self-pay | Admitting: Internal Medicine

## 2019-03-07 ENCOUNTER — Ambulatory Visit: Payer: BC Managed Care – PPO | Admitting: Internal Medicine

## 2019-03-07 VITALS — BP 125/86 | HR 73 | Resp 16 | Ht 73.0 in | Wt 269.0 lb

## 2019-03-07 DIAGNOSIS — J45909 Unspecified asthma, uncomplicated: Secondary | ICD-10-CM | POA: Diagnosis not present

## 2019-03-07 DIAGNOSIS — R0602 Shortness of breath: Secondary | ICD-10-CM

## 2019-03-07 DIAGNOSIS — I10 Essential (primary) hypertension: Secondary | ICD-10-CM | POA: Diagnosis not present

## 2019-03-07 DIAGNOSIS — K219 Gastro-esophageal reflux disease without esophagitis: Secondary | ICD-10-CM

## 2019-03-07 DIAGNOSIS — G4733 Obstructive sleep apnea (adult) (pediatric): Secondary | ICD-10-CM

## 2019-03-07 NOTE — Progress Notes (Signed)
[facility] [address] [place], [place] [address]  Pulmonary Sleep Medicine   Office Visit Note  Patient Name: Luis Hendrix. DOB: July 29, 1965 MRN [id number]  Date of Service: 03/07/2019  Complaints/HPI: Pt is here for one year follow up. Overall he is doing well.  He has maintined his weight since bariatric surgery, and does not have symptoms of sleep apnea any longer.  He reports he is sleeping well.  He does not snore, he has good energy during the day.  He denies needing to nap in the afternoon, or falling asleep while driving.   ROS  General: (-) fever, (-) chills, (-) night sweats, (-) weakness Skin: (-) rashes, (-) itching,. Eyes: (-) visual changes, (-) redness, (-) itching. Nose and Sinuses: (-) nasal stuffiness or itchiness, (-) postnasal drip, (-) nosebleeds, (-) sinus trouble. Mouth and Throat: (-) sore throat, (-) hoarseness. Neck: (-) swollen glands, (-) enlarged thyroid, (-) neck pain. Respiratory: - cough, (-) bloody sputum, - shortness of breath, - wheezing. Cardiovascular: - ankle swelling, (-) chest pain. Lymphatic: (-) lymph node enlargement. Neurologic: (-) numbness, (-) tingling. Psychiatric: (-) anxiety, (-) depression   Current Medication: Outpatient Encounter Medications as of 03/07/2019  Medication Sig  . albuterol (PROAIR HFA) 108 (90 Base) MCG/ACT inhaler ProAir HFA 90 mcg/actuation aerosol inhaler  . budesonide-formoterol (SYMBICORT) 160-4.5 MCG/ACT inhaler Symbicort 160 mcg-4.5 mcg/actuation HFA aerosol inhaler  . colchicine 0.6 MG tablet colchicine 0.6 mg tablet  . indomethacin (INDOCIN) 50 MG capsule indomethacin 50 mg capsule  . lisinopril (PRINIVIL,ZESTRIL) 10 MG tablet TAKE 1 TABLET BY MOUTH ONCE DAILY FOR BLOOD PRESSURE  . ondansetron (ZOFRAN-ODT) 4 MG disintegrating tablet ondansetron 4 mg disintegrating tablet  . phentermine (ADIPEX-P) 37.5 MG tablet Take 1 tablet (37.5 mg total) by mouth daily before breakfast.  .  SYMBICORT 160-4.5 MCG/ACT inhaler Inhale 1 puff into the lungs 2 (two) times daily as needed.   No facility-administered encounter medications on file as of 03/07/2019.     Surgical History: Past Surgical History:  Procedure Laterality Date  . ELBOW FRACTURE SURGERY Right    Approx age 83  . LAPAROSCOPIC GASTRIC SLEEVE RESECTION      Medical History: Past Medical History:  Diagnosis Date  . Asthma   . Hypertension   . Sleep apnea     Family History: Family History  Problem Relation Age of Onset  . Hypertension Father     Social History: Social History   Socioeconomic History  . Marital status: Married    Spouse name: Not on file  . Number of children: Not on file  . Years of education: Not on file  . Highest education level: Not on file  Occupational History  . Not on file  Social Needs  . Financial resource strain: Not on file  . Food insecurity    Worry: Not on file    Inability: Not on file  . Transportation needs    Medical: Not on file    Non-medical: Not on file  Tobacco Use  . Smoking status: Never Smoker  . Smokeless tobacco: Never Used  Substance and Sexual Activity  . Alcohol use: Yes    Comment: OCCASIONALLY  . Drug use: No  . Sexual activity: Yes    Partners: Female  Lifestyle  . Physical activity    Days per week: Not on file    Minutes per session: Not on file  . Stress: Not on file  Relationships  . Social connections  Talks on phone: Not on file    Gets together: Not on file    Attends religious service: Not on file    Active member of club or organization: Not on file    Attends meetings of clubs or organizations: Not on file    Relationship status: Not on file  . Intimate partner violence    Fear of current or ex partner: Not on file    Emotionally abused: Not on file    Physically abused: Not on file    Forced sexual activity: Not on file  Other Topics Concern  . Not on file  Social History Narrative  . Not on file     Vital Signs: Blood pressure 125/86, pulse 73, resp. rate 16, height 6\' 1"  (1.854 m), weight 269 lb (122 kg), SpO2 98 %.  Examination: General Appearance: The patient is well-developed, well-nourished, and in no distress. Skin: Gross inspection of skin unremarkable. Head: normocephalic, no gross deformities. Eyes: no gross deformities noted. ENT: ears appear grossly normal no exudates. Neck: Supple. No thyromegaly. No LAD. Respiratory: clear bilaterally. Cardiovascular: Normal S1 and S2 without murmur or rub. Extremities: No cyanosis. pulses are equal. Neurologic: Alert and oriented. No involuntary movements.  LABS: No results found for this or any previous visit (from the past 2160 hour(s)).  Radiology: No results found.  No results found.  No results found.    Assessment and Plan: Patient Active Problem List   Diagnosis Date Noted  . Moderate asthma without complication 12/15/2017  . Essential hypertension 02/10/2016  . Heartburn 02/10/2016  . Moderate obesity 02/10/2016  . Obstructive sleep apnea syndrome 02/10/2016    1. Gastroesophageal reflux disease without esophagitis Stable, resolved since gastric surgery.   2. Moderate asthma without complication, unspecified whether persistent Stable, continue to use Symbicort daily, and albuterol as needed.   3. Essential hypertension Stable, continue present management.   4. Obstructive sleep apnea syndrome No longer treated, OSA resolved with bariatric surgery.  5. Shortness of breath - Spirometry with Graph  General Counseling: I have discussed the findings of the evaluation and examination with Luis Hendrix.  I have also discussed any further diagnostic evaluation thatmay be needed or ordered today. Luis Hendrix verbalizes understanding of the findings of todays visit. We also reviewed his medications today and discussed drug interactions and side effects including but not limited excessive drowsiness and altered mental  states. We also discussed that there is always a risk not just to him but also people around him. he has been encouraged to call the office with any questions or concerns that should arise related to todays visit.    Time spent: 15 This patient was seen by Blima LedgerAdam Khristian Seals AGNP-C in Collaboration with Dr. Freda MunroSaadat Khan as a part of collaborative care agreement.   I have personally obtained a history, examined the patient, evaluated laboratory and imaging results, formulated the assessment and plan and placed orders.    Yevonne PaxSaadat A Khan, MD [facility] Pulmonary and Critical Care Sleep medicine

## 2019-03-11 ENCOUNTER — Other Ambulatory Visit: Payer: Self-pay

## 2019-03-11 ENCOUNTER — Encounter: Payer: Self-pay | Admitting: Nurse Practitioner

## 2019-03-11 ENCOUNTER — Ambulatory Visit: Payer: BC Managed Care – PPO | Admitting: Nurse Practitioner

## 2019-03-11 VITALS — BP 131/87 | HR 78 | Resp 16 | Ht 73.0 in | Wt 268.2 lb

## 2019-03-11 DIAGNOSIS — I10 Essential (primary) hypertension: Secondary | ICD-10-CM | POA: Diagnosis not present

## 2019-03-11 DIAGNOSIS — G4733 Obstructive sleep apnea (adult) (pediatric): Secondary | ICD-10-CM | POA: Diagnosis not present

## 2019-03-11 DIAGNOSIS — J45909 Unspecified asthma, uncomplicated: Secondary | ICD-10-CM

## 2019-03-11 DIAGNOSIS — E668 Other obesity: Secondary | ICD-10-CM | POA: Diagnosis not present

## 2019-03-11 NOTE — Progress Notes (Signed)
[facility] [place], [place] [address]  Internal MEDICINE  Office Visit Note  Patient Name: Luis Hendrix  [id number]  [id number]  Date of Service: 03/13/2019  Chief Complaint  Patient presents with  . Follow-up    weight management    The patient is here for follow up of weight management. Currently taking phentermine daily. Has had weight gain rather than weight loss. Is carefully watching his diet, but exercise has been difficult due to spread of COVID 19. Blood pressure and asthma are both well controlled.       Current Medication: Outpatient Encounter Medications as of 03/11/2019  Medication Sig  . albuterol (PROAIR HFA) 108 (90 Base) MCG/ACT inhaler ProAir HFA 90 mcg/actuation aerosol inhaler  . budesonide-formoterol (SYMBICORT) 160-4.5 MCG/ACT inhaler Symbicort 160 mcg-4.5 mcg/actuation HFA aerosol inhaler  . colchicine 0.6 MG tablet colchicine 0.6 mg tablet  . indomethacin (INDOCIN) 50 MG capsule indomethacin 50 mg capsule  . lisinopril (PRINIVIL,ZESTRIL) 10 MG tablet TAKE 1 TABLET BY MOUTH ONCE DAILY FOR BLOOD PRESSURE  . ondansetron (ZOFRAN-ODT) 4 MG disintegrating tablet ondansetron 4 mg disintegrating tablet  . phentermine (ADIPEX-P) 37.5 MG tablet Take 1 tablet (37.5 mg total) by mouth daily before breakfast.  . SYMBICORT 160-4.5 MCG/ACT inhaler Inhale 1 puff into the lungs 2 (two) times daily as needed.   No facility-administered encounter medications on file as of 03/11/2019.     Surgical History: Past Surgical History:  Procedure Laterality Date  . ELBOW FRACTURE SURGERY Right    Approx age 33  . LAPAROSCOPIC GASTRIC SLEEVE RESECTION      Medical History: Past Medical History:  Diagnosis Date  . Asthma   . Hypertension   . Sleep apnea     Family History: Family History  Problem Relation Age of Onset  . Hypertension Father     Social History   Socioeconomic History  . Marital status: Married    Spouse name:  Not on file  . Number of children: Not on file  . Years of education: Not on file  . Highest education level: Not on file  Occupational History  . Not on file  Social Needs  . Financial resource strain: Not on file  . Food insecurity    Worry: Not on file    Inability: Not on file  . Transportation needs    Medical: Not on file    Non-medical: Not on file  Tobacco Use  . Smoking status: Never Smoker  . Smokeless tobacco: Never Used  Substance and Sexual Activity  . Alcohol use: Yes    Comment: OCCASIONALLY  . Drug use: No  . Sexual activity: Yes    Partners: Female  Lifestyle  . Physical activity    Days per week: Not on file    Minutes per session: Not on file  . Stress: Not on file  Relationships  . Social Herbalist on phone: Not on file    Gets together: Not on file    Attends religious service: Not on file    Active member of club or organization: Not on file    Attends meetings of clubs or organizations: Not on file    Relationship status: Not on file  . Intimate partner violence    Fear of current or ex partner: Not on file    Emotionally abused: Not on file    Physically abused: Not on file    Forced sexual activity: Not on file  Other Topics Concern  . Not on file  Social History Narrative  . Not on file      Review of Systems  Constitutional: Negative for activity change, appetite change, fatigue, fever and unexpected weight change.  HENT: Negative for congestion, facial swelling, hearing loss, postnasal drip, sinus pressure, sore throat and voice change.   Respiratory: Negative for apnea, cough, chest tightness, shortness of breath and wheezing.   Cardiovascular: Negative for chest pain and palpitations.  Endocrine: Negative for cold intolerance, heat intolerance, polydipsia and polyuria.  Musculoskeletal: Negative for arthralgias, back pain, gait problem, myalgias and neck stiffness.  Skin: Negative for color change, pallor, rash and  wound.  Allergic/Immunologic: Positive for environmental allergies.  Neurological: Negative for dizziness, tremors, facial asymmetry, light-headedness, numbness and headaches.  Hematological: Negative for adenopathy.  Psychiatric/Behavioral: Negative for agitation, dysphoric mood and suicidal ideas. The patient is not nervous/anxious.     Today's Vitals   03/11/19 1609  BP: 131/87  Pulse: 78  Resp: 16  SpO2: 100%  Weight: 268 lb 3.2 oz (121.7 kg)  Height: 6\' 1"  (1.854 m)   Body mass index is 35.38 kg/m.  Physical Exam Vitals signs and nursing note reviewed.  Constitutional:      Appearance: Normal appearance. He is well-developed.  HENT:     Head: Normocephalic and atraumatic.  Eyes:     Conjunctiva/sclera: Conjunctivae normal.     Pupils: Pupils are equal, round, and reactive to light.  Neck:     Musculoskeletal: Normal range of motion and neck supple.     Thyroid: No thyromegaly.     Vascular: No JVD.     Trachea: No tracheal deviation.  Cardiovascular:     Rate and Rhythm: Normal rate and regular rhythm.     Heart sounds: Normal heart sounds.  Pulmonary:     Effort: Pulmonary effort is normal. No respiratory distress.     Breath sounds: Normal breath sounds. No stridor. No wheezing or rales.  Chest:     Chest wall: No tenderness.  Abdominal:     General: There is no distension.     Palpations: Abdomen is soft. There is no mass.     Tenderness: There is no abdominal tenderness. There is no guarding.  Musculoskeletal: Normal range of motion.  Lymphadenopathy:     Cervical: No cervical adenopathy.  Skin:    General: Skin is warm and dry.  Neurological:     Mental Status: He is alert and oriented to person, place, and time.  Psychiatric:        Mood and Affect: Mood normal.        Behavior: Behavior normal.        Thought Content: Thought content normal.        Judgment: Judgment normal.    Assessment/Plan:  1. Essential hypertension Stable. Continue BP  medication as prescribed   2. Moderate asthma without complication, unspecified whether persistent Well controlled. Continue inhalers as precribed   3. Obstructive sleep apnea syndrome Regular visits with Dr. Freda MunroSaadat Khan as scheduled.   4. Moderate obesity Will break from weight loss medication for now. Limit calorie count to 1500 calories per day and incorporate exercise into daily routine.   General Counseling: Armstead PeaksGeorge verbalizes understanding of the findings of todays visit and agrees with plan of treatment. I have discussed any further diagnostic evaluation that may be needed or ordered today. We also reviewed his medications today. he has been encouraged to call the office with any questions  or concerns that should arise related to todays visit.  Hypertension Counseling:   The following hypertensive lifestyle modification were recommended and discussed:  1. Limiting alcohol intake to less than 1 oz/day of ethanol:(24 oz of beer or 8 oz of wine or 2 oz of 100-proof whiskey). 2. Take baby ASA 81 mg daily. 3. Importance of regular aerobic exercise and losing weight. 4. Reduce dietary saturated fat and cholesterol intake for overall cardiovascular health. 5. Maintaining adequate dietary potassium, calcium, and magnesium intake. 6. Regular monitoring of the blood pressure. 7. Reduce sodium intake to less than 100 mmol/day (less than 2.3 gm of sodium or less than 6 gm of sodium choride)   This patient was seen by Vincent GrosHeather Jerimah Witucki FNP Collaboration with Dr Lyndon CodeFozia M Khan as a part of collaborative care agreement  Time spent: 15 Minutes      Dr Lyndon CodeFozia M Khan Internal medicine

## 2019-03-29 ENCOUNTER — Other Ambulatory Visit: Payer: Self-pay | Admitting: Nurse Practitioner

## 2019-03-30 LAB — COMPREHENSIVE METABOLIC PANEL
ALT: 20 IU/L (ref 0–44)
AST: 25 IU/L (ref 0–40)
Albumin/Globulin Ratio: 2 (ref 1.2–2.2)
Albumin: 4.6 g/dL (ref 4.0–5.0)
Alkaline Phosphatase: 70 IU/L (ref 39–117)
BUN/Creatinine Ratio: 16 (ref 9–20)
BUN: 17 mg/dL (ref 6–24)
Bilirubin Total: 0.4 mg/dL (ref 0.0–1.2)
CO2: 23 mmol/L (ref 20–29)
Calcium: 9.8 mg/dL (ref 8.7–10.2)
Chloride: 100 mmol/L (ref 96–106)
Creatinine, Ser: 1.06 mg/dL (ref 0.76–1.27)
GFR calc Af Amer: 94 mL/min/{1.73_m2} (ref >59)
GFR calc non Af Amer: 81 mL/min/{1.73_m2} (ref >59)
Globulin, Total: 2.3 g/dL (ref 1.5–4.5)
Glucose: 88 mg/dL (ref 65–99)
Potassium: 4.7 mmol/L (ref 3.5–5.2)
Sodium: 137 mmol/L (ref 134–144)
Total Protein: 6.9 g/dL (ref 6.0–8.5)

## 2019-03-30 LAB — LIPID PANEL W/O CHOL/HDL RATIO
Cholesterol, Total: 183 mg/dL (ref 100–199)
HDL: 64 mg/dL (ref >39)
LDL Chol Calc (NIH): 111 mg/dL — ABNORMAL HIGH (ref 0–99)
Triglycerides: 41 mg/dL (ref 0–149)
VLDL Cholesterol Cal: 8 mg/dL (ref 5–40)

## 2019-03-30 LAB — PSA: Prostate Specific Ag, Serum: 0.7 ng/mL (ref 0.0–4.0)

## 2019-03-30 LAB — TSH: TSH: 2.07 u[IU]/mL (ref 0.450–4.500)

## 2019-03-30 LAB — CBC
Hematocrit: 44.9 % (ref 37.5–51.0)
Hemoglobin: 14.5 g/dL (ref 13.0–17.7)
MCH: 27.1 pg (ref 26.6–33.0)
MCHC: 32.3 g/dL (ref 31.5–35.7)
MCV: 84 fL (ref 79–97)
Platelets: 240 10*3/uL (ref 150–450)
RBC: 5.36 x10E6/uL (ref 4.14–5.80)
RDW: 12.4 % (ref 11.6–15.4)
WBC: 5 10*3/uL (ref 3.4–10.8)

## 2019-03-30 LAB — T4, FREE: Free T4: 1.35 ng/dL (ref 0.82–1.77)

## 2019-04-03 NOTE — Progress Notes (Signed)
Labs good. Discuss at visit 04/23/2019

## 2019-04-23 ENCOUNTER — Ambulatory Visit: Payer: BC Managed Care – PPO | Admitting: Nurse Practitioner

## 2019-04-23 ENCOUNTER — Other Ambulatory Visit: Payer: Self-pay

## 2019-04-23 ENCOUNTER — Encounter: Payer: Self-pay | Admitting: Nurse Practitioner

## 2019-04-23 VITALS — BP 118/86 | HR 76 | Temp 97.1°F | Resp 16 | Ht 73.0 in | Wt 269.0 lb

## 2019-04-23 DIAGNOSIS — J45909 Unspecified asthma, uncomplicated: Secondary | ICD-10-CM | POA: Diagnosis not present

## 2019-04-23 DIAGNOSIS — E668 Other obesity: Secondary | ICD-10-CM | POA: Diagnosis not present

## 2019-04-23 DIAGNOSIS — E785 Hyperlipidemia, unspecified: Secondary | ICD-10-CM | POA: Diagnosis not present

## 2019-04-23 DIAGNOSIS — I10 Essential (primary) hypertension: Secondary | ICD-10-CM | POA: Diagnosis not present

## 2019-04-23 MED ORDER — PHENTERMINE HCL 37.5 MG PO TABS: 37.5000 mg | ORAL_TABLET | Freq: Every day | ORAL | 1 refills | Status: DC

## 2019-04-23 NOTE — Progress Notes (Signed)
[facility] [place], [place] [address]  Internal MEDICINE  Office Visit Note  Patient Name: Luis Hendrix  [id number]  [id number]  Date of Service: 04/23/2019  Chief Complaint  Patient presents with  . Follow-up    weight management, labs  . Hypertension    The patient is here for routine follow up visit. He has taken a break from appetite suppressant since his last visit. He has gained one pound since he was last seen. Would like to do one more thirty day trial of phentermine. Gym has opened back up. Feels like, now that he can add exercise back into his routine, he can keep weight well managed. His blood pressure is doing very well. His asthma is doing well also. He has had labs done since he was last seen. His LDL was mildly elevated, however, all other labs were normal.       Current Medication: Outpatient Encounter Medications as of 04/23/2019  Medication Sig  . albuterol (PROAIR HFA) 108 (90 Base) MCG/ACT inhaler ProAir HFA 90 mcg/actuation aerosol inhaler  . budesonide-formoterol (SYMBICORT) 160-4.5 MCG/ACT inhaler Symbicort 160 mcg-4.5 mcg/actuation HFA aerosol inhaler  . colchicine 0.6 MG tablet colchicine 0.6 mg tablet  . indomethacin (INDOCIN) 50 MG capsule indomethacin 50 mg capsule  . lisinopril (PRINIVIL,ZESTRIL) 10 MG tablet TAKE 1 TABLET BY MOUTH ONCE DAILY FOR BLOOD PRESSURE  . ondansetron (ZOFRAN-ODT) 4 MG disintegrating tablet ondansetron 4 mg disintegrating tablet  . phentermine (ADIPEX-P) 37.5 MG tablet Take 1 tablet (37.5 mg total) by mouth daily before breakfast.  . SYMBICORT 160-4.5 MCG/ACT inhaler Inhale 1 puff into the lungs 2 (two) times daily as needed.  . [DISCONTINUED] phentermine (ADIPEX-P) 37.5 MG tablet Take 1 tablet (37.5 mg total) by mouth daily before breakfast.   No facility-administered encounter medications on file as of 04/23/2019.     Surgical History: Past Surgical History:  Procedure Laterality Date  .  ELBOW FRACTURE SURGERY Right    Approx age 13  . LAPAROSCOPIC GASTRIC SLEEVE RESECTION      Medical History: Past Medical History:  Diagnosis Date  . Asthma   . Hypertension   . Sleep apnea     Family History: Family History  Problem Relation Age of Onset  . Hypertension Father     Social History   Socioeconomic History  . Marital status: Married    Spouse name: Not on file  . Number of children: Not on file  . Years of education: Not on file  . Highest education level: Not on file  Occupational History  . Not on file  Social Needs  . Financial resource strain: Not on file  . Food insecurity    Worry: Not on file    Inability: Not on file  . Transportation needs    Medical: Not on file    Non-medical: Not on file  Tobacco Use  . Smoking status: Never Smoker  . Smokeless tobacco: Never Used  Substance and Sexual Activity  . Alcohol use: Yes    Comment: OCCASIONALLY  . Drug use: No  . Sexual activity: Yes    Partners: Female  Lifestyle  . Physical activity    Days per week: Not on file    Minutes per session: Not on file  . Stress: Not on file  Relationships  . Social Herbalist on phone: Not on file    Gets together: Not on file    Attends religious service: Not  on file    Active member of club or organization: Not on file    Attends meetings of clubs or organizations: Not on file    Relationship status: Not on file  . Intimate partner violence    Fear of current or ex partner: Not on file    Emotionally abused: Not on file    Physically abused: Not on file    Forced sexual activity: Not on file  Other Topics Concern  . Not on file  Social History Narrative  . Not on file      Review of Systems  Constitutional: Negative for activity change, appetite change, fatigue, fever and unexpected weight change.       Weight gain of one pound since last visit.   HENT: Negative for congestion, facial swelling, hearing loss, postnasal drip, sinus  pressure, sore throat and voice change.   Respiratory: Negative for apnea, cough, chest tightness, shortness of breath and wheezing.   Cardiovascular: Negative for chest pain and palpitations.  Endocrine: Negative for cold intolerance, heat intolerance, polydipsia and polyuria.  Musculoskeletal: Negative for arthralgias, back pain, gait problem, myalgias and neck stiffness.  Skin: Negative for color change, pallor, rash and wound.  Allergic/Immunologic: Positive for environmental allergies.  Neurological: Negative for dizziness, tremors, facial asymmetry, light-headedness, numbness and headaches.  Hematological: Negative for adenopathy.  Psychiatric/Behavioral: Negative for agitation, dysphoric mood and suicidal ideas. The patient is not nervous/anxious.    Today's Vitals   04/23/19 0856  BP: 118/86  Pulse: 76  Resp: 16  Temp: (!) 97.1 F (36.2 C)  SpO2: 97%  Weight: 269 lb (122 kg)  Height: 6\' 1"  (1.854 m)   Body mass index is 35.49 kg/m.  Physical Exam Vitals signs and nursing note reviewed.  Constitutional:      Appearance: Normal appearance. He is well-developed.  HENT:     Head: Normocephalic and atraumatic.  Eyes:     Conjunctiva/sclera: Conjunctivae normal.     Pupils: Pupils are equal, round, and reactive to light.  Neck:     Musculoskeletal: Normal range of motion and neck supple.     Thyroid: No thyromegaly.     Vascular: No JVD.     Trachea: No tracheal deviation.  Cardiovascular:     Rate and Rhythm: Normal rate and regular rhythm.     Heart sounds: Normal heart sounds.  Pulmonary:     Effort: Pulmonary effort is normal. No respiratory distress.     Breath sounds: Normal breath sounds. No stridor. No wheezing or rales.  Chest:     Chest wall: No tenderness.  Abdominal:     General: There is no distension.     Palpations: Abdomen is soft. There is no mass.     Tenderness: There is no abdominal tenderness. There is no guarding.  Musculoskeletal: Normal  range of motion.  Lymphadenopathy:     Cervical: No cervical adenopathy.  Skin:    General: Skin is warm and dry.  Neurological:     Mental Status: He is alert and oriented to person, place, and time.  Psychiatric:        Mood and Affect: Mood normal.        Behavior: Behavior normal.        Thought Content: Thought content normal.        Judgment: Judgment normal.   Assessment/Plan:  1. Essential hypertension Stable. Continue bp medication as prescribed   2. Moderate asthma without complication, unspecified whether persistent Well managed. Continue  inhalers as prescribed   3. Moderate obesity Restart phentermine. May take daily. Limit calorie intake to 1500 calories per day and add back exercise into daily routine  - phentermine (ADIPEX-P) 37.5 MG tablet; Take 1 tablet (37.5 mg total) by mouth daily before breakfast.  Dispense: 30 tablet; Refill: 1  4. Hyperlipidemia LDL goal <100 Reviewed labs showing mild elevation of LDL at 111. Exercise and weight management to help with lowering cholesterol. Will recheck next year.   General Counseling: princeston blizzard understanding of the findings of todays visit and agrees with plan of treatment. I have discussed any further diagnostic evaluation that may be needed or ordered today. We also reviewed his medications today. he has been encouraged to call the office with any questions or concerns that should arise related to todays visit.   There is a liability release in patients' chart. There has been a 10 minute discussion about the side effects including but not limited to elevated blood pressure, anxiety, lack of sleep and dry mouth. Pt understands and will like to start/continue on appetite suppressant at this time. There will be one month RX given at the time of visit with proper follow up. Nova diet plan with restricted calories is given to the pt. Pt understands and agrees with  plan of treatment  This patient was seen by Vincent Gros FNP Collaboration with Dr Lyndon Code as a part of collaborative care agreement  Meds ordered this encounter  Medications  . phentermine (ADIPEX-P) 37.5 MG tablet    Sig: Take 1 tablet (37.5 mg total) by mouth daily before breakfast.    Dispense:  30 tablet    Refill:  1    Order Specific Question:   Supervising Provider    Answer:   Lyndon Code [[id number]]    Time spent: 87 Minutes      Dr Lyndon Code Internal medicine

## 2019-05-09 ENCOUNTER — Other Ambulatory Visit: Payer: Self-pay | Admitting: Nurse Practitioner

## 2019-05-09 MED ORDER — LISINOPRIL 10 MG PO TABS: ORAL_TABLET | ORAL | 4 refills | Status: DC

## 2019-05-10 ENCOUNTER — Other Ambulatory Visit: Payer: Self-pay

## 2019-05-10 MED ORDER — LISINOPRIL 10 MG PO TABS: ORAL_TABLET | ORAL | 4 refills | Status: DC

## 2019-06-17 ENCOUNTER — Other Ambulatory Visit: Payer: Self-pay

## 2019-06-17 MED ORDER — BUDESONIDE-FORMOTEROL FUMARATE 160-4.5 MCG/ACT IN AERO: INHALATION_SPRAY | RESPIRATORY_TRACT | 5 refills | Status: DC

## 2019-06-17 MED ORDER — ALBUTEROL SULFATE HFA 108 (90 BASE) MCG/ACT IN AERS: INHALATION_SPRAY | RESPIRATORY_TRACT | 5 refills | Status: DC

## 2019-06-17 MED ORDER — BUDESONIDE-FORMOTEROL FUMARATE 160-4.5 MCG/ACT IN AERO: 2.0000 | INHALATION_SPRAY | Freq: Two times a day (BID) | RESPIRATORY_TRACT | 5 refills | Status: DC

## 2019-07-24 ENCOUNTER — Telehealth: Payer: Self-pay

## 2019-07-24 NOTE — Telephone Encounter (Signed)
CONFIRMED AND SCREENED FOR 07-26-19 OV. °

## 2019-07-25 ENCOUNTER — Other Ambulatory Visit: Payer: Self-pay

## 2019-07-25 ENCOUNTER — Ambulatory Visit: Payer: BC Managed Care – PPO | Admitting: Nurse Practitioner

## 2019-07-25 ENCOUNTER — Encounter: Payer: Self-pay | Admitting: Nurse Practitioner

## 2019-07-25 VITALS — Ht 73.0 in

## 2019-07-25 DIAGNOSIS — G4733 Obstructive sleep apnea (adult) (pediatric): Secondary | ICD-10-CM | POA: Diagnosis not present

## 2019-07-25 DIAGNOSIS — I10 Essential (primary) hypertension: Secondary | ICD-10-CM

## 2019-07-25 DIAGNOSIS — J45909 Unspecified asthma, uncomplicated: Secondary | ICD-10-CM

## 2019-07-25 NOTE — Progress Notes (Signed)
[facility] [place], [place] [address]  Internal MEDICINE  Telephone Visit  Patient Name: Luis Hendrix  [id number]  [id number]  Date of Service: 07/28/2019  I connected with the patient at 2:48pm by telephone and verified the patients identity using two identifiers.   I discussed the limitations, risks, security and privacy concerns of performing an evaluation and management service by telephone and the availability of in person appointments. I also discussed with the patient that there may be a patient responsible charge related to the service.  The patient expressed understanding and agrees to proceed.    Chief Complaint  Patient presents with  . Telephone Assessment  . Telephone Screen  . Hypertension    The patient has been contacted via telephone for follow up visit due to concerns for spread of novel coronavirus. The patient presents for follow up. He states that blood pressure is doing well. Has been more physically active. Has lost about 4 pounds since he was last seen. He is still not taking phentermine. Maintaining his weight without assistance. His asthma is sell controlled. No exacerbations reported.       Current Medication: Outpatient Encounter Medications as of 07/25/2019  Medication Sig  . albuterol (PROAIR HFA) 108 (90 Base) MCG/ACT inhaler ProAir HFA 90 mcg/actuation aerosol inhaler  . budesonide-formoterol (SYMBICORT) 160-4.5 MCG/ACT inhaler Inhale 2 puffs into the lungs 2 (two) times daily.  . colchicine 0.6 MG tablet colchicine 0.6 mg tablet  . indomethacin (INDOCIN) 50 MG capsule indomethacin 50 mg capsule  . lisinopril (ZESTRIL) 10 MG tablet TAKE 1 TABLET BY MOUTH ONCE DAILY FOR BLOOD PRESSURE  . ondansetron (ZOFRAN-ODT) 4 MG disintegrating tablet ondansetron 4 mg disintegrating tablet  . phentermine (ADIPEX-P) 37.5 MG tablet Take 1 tablet (37.5 mg total) by mouth daily before breakfast.  . SYMBICORT 160-4.5 MCG/ACT inhaler  Inhale 1 puff into the lungs 2 (two) times daily as needed.   No facility-administered encounter medications on file as of 07/25/2019.    Surgical History: Past Surgical History:  Procedure Laterality Date  . ELBOW FRACTURE SURGERY Right    Approx age 27  . LAPAROSCOPIC GASTRIC SLEEVE RESECTION      Medical History: Past Medical History:  Diagnosis Date  . Asthma   . Hypertension   . Sleep apnea     Family History: Family History  Problem Relation Age of Onset  . Hypertension Father     Social History   Socioeconomic History  . Marital status: Married    Spouse name: Not on file  . Number of children: Not on file  . Years of education: Not on file  . Highest education level: Not on file  Occupational History  . Not on file  Tobacco Use  . Smoking status: Never Smoker  . Smokeless tobacco: Never Used  Substance and Sexual Activity  . Alcohol use: Yes    Comment: OCCASIONALLY  . Drug use: No  . Sexual activity: Yes    Partners: Female  Other Topics Concern  . Not on file  Social History Narrative  . Not on file   Social Determinants of Health   Financial Resource Strain:   . Difficulty of Paying Living Expenses: Not on file  Food Insecurity:   . Worried About Charity fundraiser in the Last Year: Not on file  . Ran Out of Food in the Last Year: Not on file  Transportation Needs:   . Lack of Transportation (Medical): Not on  file  . Lack of Transportation (Non-Medical): Not on file  Physical Activity:   . Days of Exercise per Week: Not on file  . Minutes of Exercise per Session: Not on file  Stress:   . Feeling of Stress : Not on file  Social Connections:   . Frequency of Communication with Friends and Family: Not on file  . Frequency of Social Gatherings with Friends and Family: Not on file  . Attends Religious Services: Not on file  . Active Member of Clubs or Organizations: Not on file  . Attends Banker Meetings: Not on file  . Marital  Status: Not on file  Intimate Partner Violence:   . Fear of Current or Ex-Partner: Not on file  . Emotionally Abused: Not on file  . Physically Abused: Not on file  . Sexually Abused: Not on file      Review of Systems  Constitutional: Negative for activity change, appetite change, fatigue, fever and unexpected weight change.       Weight loss of about 4 pounds since most recent visit.   HENT: Negative for congestion, facial swelling, hearing loss, postnasal drip, sinus pressure, sore throat and voice change.   Respiratory: Negative for apnea, cough, chest tightness, shortness of breath and wheezing.   Cardiovascular: Negative for chest pain and palpitations.  Endocrine: Negative for cold intolerance, heat intolerance, polydipsia and polyuria.  Musculoskeletal: Negative for arthralgias, back pain, gait problem, myalgias and neck stiffness.  Skin: Negative for color change, pallor, rash and wound.  Allergic/Immunologic: Positive for environmental allergies.  Neurological: Negative for dizziness, tremors, facial asymmetry, light-headedness, numbness and headaches.  Hematological: Negative for adenopathy.  Psychiatric/Behavioral: Negative for agitation, dysphoric mood and suicidal ideas. The patient is not nervous/anxious.     Today's Vitals   07/25/19 1412  Height: 6\' 1"  (1.854 m)   Body mass index is 35.49 kg/m.  Observation/Objective:  The patient is alert and oriented. He is pleasant and answering all questions appropriately. Breathing is non-labored. He is in no acute distress.    Assessment/Plan: 1. Moderate asthma without complication, unspecified whether persistent Stable with no recent exacerbations. Continue inhalers as prescribed   2. Essential hypertension Stable. Continue bp medication as prescribed   3. Obstructive sleep apnea syndrome Continue regular visits with Dr. for CPAP management.   General Counseling: braxtyn bojarski understanding of  the findings of today's phone visit and agrees with plan of treatment. I have discussed any further diagnostic evaluation that may be needed or ordered today. We also reviewed his medications today. he has been encouraged to call the office with any questions or concerns that should arise related to todays visit.  Hypertension Counseling:   The following hypertensive lifestyle modification were recommended and discussed:  1. Limiting alcohol intake to less than 1 oz/day of ethanol:(24 oz of beer or 8 oz of wine or 2 oz of 100-proof whiskey). 2. Take baby ASA 81 mg daily. 3. Importance of regular aerobic exercise and losing weight. 4. Reduce dietary saturated fat and cholesterol intake for overall cardiovascular health. 5. Maintaining adequate dietary potassium, calcium, and magnesium intake. 6. Regular monitoring of the blood pressure. 7. Reduce sodium intake to less than 100 mmol/day (less than 2.3 gm of sodium or less than 6 gm of sodium choride)   This patient was seen by Armstead Peaks FNP Collaboration with Dr Vincent Gros as a part of collaborative care agreement  Time spent: 25 Minutes    Dr 26  Berna Spare Internal medicine

## 2019-07-26 ENCOUNTER — Ambulatory Visit: Payer: BC Managed Care – PPO | Admitting: Nurse Practitioner

## 2019-09-21 ENCOUNTER — Other Ambulatory Visit: Payer: Self-pay

## 2019-09-21 ENCOUNTER — Ambulatory Visit: Payer: BC Managed Care – PPO | Attending: Internal Medicine

## 2019-09-21 DIAGNOSIS — Z23 Encounter for immunization: Secondary | ICD-10-CM

## 2019-09-21 NOTE — Progress Notes (Signed)
   [id number]  Name:  Luis Hendrix.    MRN: [id number] DOB: 09/23/1965  09/21/2019  Luis Hendrix was observed post Covid-19 immunization for 15 minutes without incident. He was provided with Vaccine Information Sheet and instruction to access the V-Safe system.   Luis Hendrix was instructed to call 911 with any severe reactions post vaccine: Marland Kitchen Difficulty breathing  . Swelling of face and throat  . A fast heartbeat  . A bad rash all over body  . Dizziness and weakness   Immunizations Administered    Name Date Dose VIS Date Route   Moderna COVID-19 Vaccine 09/21/2019  1:41 PM 0.5 mL 06/18/2019 Intramuscular   Manufacturer: Moderna   Lot: [id number]   NDC: 09381-829-93

## 2019-10-19 ENCOUNTER — Ambulatory Visit: Payer: BC Managed Care – PPO | Attending: Internal Medicine

## 2019-10-19 DIAGNOSIS — Z23 Encounter for immunization: Secondary | ICD-10-CM

## 2019-10-19 NOTE — Progress Notes (Signed)
   [id number]  Name:  Delawrence Fridman.    MRN: [id number] DOB: 1966/04/03  10/19/2019  Mr. Dilauro was observed post Covid-19 immunization for 15 minutes without incident. He was provided with Vaccine Information Sheet and instruction to access the V-Safe system.   Mr. Zylstra was instructed to call 911 with any severe reactions post vaccine: Marland Kitchen Difficulty breathing  . Swelling of face and throat  . A fast heartbeat  . A bad rash all over body  . Dizziness and weakness   Immunizations Administered    Name Date Dose VIS Date Route   Moderna COVID-19 Vaccine 10/19/2019  8:09 AM 0.5 mL 06/18/2019 Intramuscular   Manufacturer: Gala Murdoch   Lot: [id number]   NDC: 80699-967-22

## 2019-10-22 ENCOUNTER — Telehealth: Payer: Self-pay

## 2019-10-22 NOTE — Telephone Encounter (Signed)
Confirmed appointment on 10/24/2019 and screened for covid. [facility] 

## 2019-10-24 ENCOUNTER — Encounter: Payer: Self-pay | Admitting: Adult Health

## 2019-10-24 ENCOUNTER — Other Ambulatory Visit: Payer: Self-pay

## 2019-10-24 ENCOUNTER — Ambulatory Visit (INDEPENDENT_AMBULATORY_CARE_PROVIDER_SITE_OTHER): Payer: BC Managed Care – PPO | Admitting: Adult Health

## 2019-10-24 VITALS — BP 137/101 | HR 74 | Temp 97.4°F | Resp 16 | Ht 73.0 in | Wt 281.0 lb

## 2019-10-24 DIAGNOSIS — J45909 Unspecified asthma, uncomplicated: Secondary | ICD-10-CM | POA: Diagnosis not present

## 2019-10-24 DIAGNOSIS — G4733 Obstructive sleep apnea (adult) (pediatric): Secondary | ICD-10-CM | POA: Diagnosis not present

## 2019-10-24 DIAGNOSIS — E785 Hyperlipidemia, unspecified: Secondary | ICD-10-CM

## 2019-10-24 DIAGNOSIS — Z6837 Body mass index (BMI) 37.0-37.9, adult: Secondary | ICD-10-CM

## 2019-10-24 DIAGNOSIS — I10 Essential (primary) hypertension: Secondary | ICD-10-CM

## 2019-10-24 DIAGNOSIS — K219 Gastro-esophageal reflux disease without esophagitis: Secondary | ICD-10-CM

## 2019-10-24 MED ORDER — LISINOPRIL 20 MG PO TABS: 20.0000 mg | ORAL_TABLET | Freq: Every day | ORAL | 3 refills | Status: DC

## 2019-10-24 NOTE — Progress Notes (Signed)
[facility] [address] [place], [place] [address]  Internal MEDICINE  Office Visit Note  Patient Name: Luis Hendrix  [id number]  [id number]  Date of Service: 11/15/2019  Chief Complaint  Patient presents with  . Follow-up  . Hypertension  . Sleep Apnea    HPI Patient is here for a follow-up visit for HTN, asthma and OSA. He was last seen via video due to COVID19. At that time he reported that his BP had been doing well. Today his DBP is elevated, denies chest pain, headaches or visual disturbances. Currently taking 10 mg Lisinopril daily, has taken his medication today prior to appointment. Documented weight gain this visit. At last visit he reported he has lost 4 pounds. Weight today is up to 281 pounds. He has been off phentermine for weight loss since January 2021. In the past he has been on 20 mg Lisinopril daily but was weaned down to 10 mg. He does report being under some stress lately. Still reports not having issues with his OSA without using CPAP at this time. Not feeling fatigued during the day or having reports of snoring during the night from his family.   Current Medication: Outpatient Encounter Medications as of 10/24/2019  Medication Sig  . albuterol (PROAIR HFA) 108 (90 Base) MCG/ACT inhaler ProAir HFA 90 mcg/actuation aerosol inhaler  . budesonide-formoterol (SYMBICORT) 160-4.5 MCG/ACT inhaler Inhale 2 puffs into the lungs 2 (two) times daily.  . colchicine 0.6 MG tablet colchicine 0.6 mg tablet  . indomethacin (INDOCIN) 50 MG capsule indomethacin 50 mg capsule  . ondansetron (ZOFRAN-ODT) 4 MG disintegrating tablet ondansetron 4 mg disintegrating tablet  . phentermine (ADIPEX-P) 37.5 MG tablet Take 1 tablet (37.5 mg total) by mouth daily before breakfast.  . SYMBICORT 160-4.5 MCG/ACT inhaler Inhale 1 puff into the lungs 2 (two) times daily as needed.  . [DISCONTINUED] lisinopril (ZESTRIL) 10 MG tablet TAKE 1 TABLET BY MOUTH ONCE DAILY FOR BLOOD  PRESSURE  . lisinopril (ZESTRIL) 20 MG tablet Take 1 tablet (20 mg total) by mouth daily.   No facility-administered encounter medications on file as of 10/24/2019.    Surgical History: Past Surgical History:  Procedure Laterality Date  . ELBOW FRACTURE SURGERY Right    Approx age 82  . LAPAROSCOPIC GASTRIC SLEEVE RESECTION      Medical History: Past Medical History:  Diagnosis Date  . Asthma   . Hypertension   . Sleep apnea     Family History: Family History  Problem Relation Age of Onset  . Hypertension Father     Social History   Socioeconomic History  . Marital status: Married    Spouse name: Not on file  . Number of children: Not on file  . Years of education: Not on file  . Highest education level: Not on file  Occupational History  . Not on file  Tobacco Use  . Smoking status: Never Smoker  . Smokeless tobacco: Never Used  Substance and Sexual Activity  . Alcohol use: Yes    Comment: OCCASIONALLY  . Drug use: No  . Sexual activity: Yes    Partners: Female  Other Topics Concern  . Not on file  Social History Narrative  . Not on file   Social Determinants of Health   Financial Resource Strain:   . Difficulty of Paying Living Expenses:   Food Insecurity:   . Worried About Programme researcher, broadcasting/film/video in the Last Year:   . Barista in  the Last Year:   Transportation Needs:   . Freight forwarder (Medical):   Marland Kitchen Lack of Transportation (Non-Medical):   Physical Activity:   . Days of Exercise per Week:   . Minutes of Exercise per Session:   Stress:   . Feeling of Stress :   Social Connections:   . Frequency of Communication with Friends and Family:   . Frequency of Social Gatherings with Friends and Family:   . Attends Religious Services:   . Active Member of Clubs or Organizations:   . Attends Banker Meetings:   Marland Kitchen Marital Status:   Intimate Partner Violence:   . Fear of Current or Ex-Partner:   . Emotionally Abused:   Marland Kitchen  Physically Abused:   . Sexually Abused:     Review of Systems  Constitutional: Negative.  Negative for chills, fatigue and unexpected weight change.  HENT: Negative.  Negative for congestion, rhinorrhea, sneezing and sore throat.   Eyes: Negative for redness.  Respiratory: Negative.  Negative for cough, chest tightness and shortness of breath.   Cardiovascular: Negative.  Negative for chest pain and palpitations.  Gastrointestinal: Negative.  Negative for abdominal pain, constipation, diarrhea, nausea and vomiting.  Endocrine: Negative.   Genitourinary: Negative.  Negative for dysuria and frequency.  Musculoskeletal: Negative.  Negative for arthralgias, back pain, joint swelling and neck pain.  Skin: Negative.  Negative for rash.  Allergic/Immunologic: Negative.   Neurological: Negative.  Negative for tremors and numbness.  Hematological: Negative for adenopathy. Does not bruise/bleed easily.  Psychiatric/Behavioral: Negative.  Negative for behavioral problems, sleep disturbance and suicidal ideas. The patient is not nervous/anxious.     Vital Signs: BP (!) 137/101 Comment: second reading  Pulse 74   Temp (!) 97.4 F (36.3 C)   Resp 16   Ht 6\' 1"  (1.854 m)   Wt 281 lb (127.5 kg)   SpO2 95%   BMI 37.07 kg/m    Physical Exam Vitals and nursing note reviewed.  Constitutional:      General: He is not in acute distress.    Appearance: He is well-developed. He is not diaphoretic.  HENT:     Head: Normocephalic and atraumatic.     Mouth/Throat:     Pharynx: No oropharyngeal exudate.  Eyes:     Pupils: Pupils are equal, round, and reactive to light.  Neck:     Thyroid: No thyromegaly.     Vascular: No JVD.     Trachea: No tracheal deviation.  Cardiovascular:     Rate and Rhythm: Normal rate and regular rhythm.     Heart sounds: Normal heart sounds. No murmur. No friction rub. No gallop.   Pulmonary:     Effort: Pulmonary effort is normal. No respiratory distress.      Breath sounds: Normal breath sounds. No wheezing or rales.  Chest:     Chest wall: No tenderness.  Abdominal:     Palpations: Abdomen is soft.     Tenderness: There is no abdominal tenderness. There is no guarding.  Musculoskeletal:        General: Normal range of motion.     Cervical back: Normal range of motion and neck supple.  Lymphadenopathy:     Cervical: No cervical adenopathy.  Skin:    General: Skin is warm and dry.  Neurological:     Mental Status: He is alert and oriented to person, place, and time.     Cranial Nerves: No cranial nerve deficit.  Psychiatric:  Behavior: Behavior normal.        Thought Content: Thought content normal.        Judgment: Judgment normal.    Assessment/Plan: 1. Essential hypertension Increase Lisinopril back to 20mg  daily.  Encouraged continued weight loss, and importance of medication compliance.  - lisinopril (ZESTRIL) 20 MG tablet; Take 1 tablet (20 mg total) by mouth daily.  Dispense: 90 tablet; Refill: 3  2. OSA (obstructive sleep apnea) Not currently using cpap,  We discussed that this could affect his blood pressure, and weight gain.    3. Moderate asthma without complication, unspecified whether persistent Stable, continue current management.  4. Hyperlipidemia LDL goal <100 Continue lifestyle modification.   5. Gastroesophageal reflux disease without esophagitis Controlled, continue to use otc meds as needed.   6. BMI 37.0-37.9, adult comorbidity include HTN, OSA , HLD and asthma.  General Counseling: brighton pilley understanding of the findings of todays visit and agrees with plan of treatment. I have discussed any further diagnostic evaluation that may be needed or ordered today. We also reviewed his medications today. he has been encouraged to call the office with any questions or concerns that should arise related to todays visit.    No orders of the defined types were placed in this encounter.   Meds  ordered this encounter  Medications  . lisinopril (ZESTRIL) 20 MG tablet    Sig: Take 1 tablet (20 mg total) by mouth daily.    Dispense:  90 tablet    Refill:  3    Time spent: 30 Minutes   This patient was seen by Orson Gear AGNP-C in Collaboration with Dr Lavera Guise as a part of collaborative care agreement     Kendell Bane AGNP-C Internal medicine

## 2019-11-08 ENCOUNTER — Telehealth: Payer: Self-pay

## 2019-11-08 NOTE — Telephone Encounter (Signed)
Confirmed and screened for 11-12-19 ov. 

## 2019-11-12 ENCOUNTER — Ambulatory Visit: Payer: BC Managed Care – PPO | Admitting: Nurse Practitioner

## 2019-11-28 ENCOUNTER — Telehealth: Payer: Self-pay

## 2019-11-28 NOTE — Telephone Encounter (Signed)
Lmom to confirm and screen for 12-03-19 ov. 

## 2019-12-03 ENCOUNTER — Ambulatory Visit (INDEPENDENT_AMBULATORY_CARE_PROVIDER_SITE_OTHER): Payer: BC Managed Care – PPO | Admitting: Nurse Practitioner

## 2019-12-03 ENCOUNTER — Other Ambulatory Visit: Payer: Self-pay

## 2019-12-03 ENCOUNTER — Encounter: Payer: Self-pay | Admitting: Nurse Practitioner

## 2019-12-03 VITALS — BP 124/90 | HR 68 | Temp 97.3°F | Resp 16 | Ht 73.0 in | Wt 282.6 lb

## 2019-12-03 DIAGNOSIS — E785 Hyperlipidemia, unspecified: Secondary | ICD-10-CM

## 2019-12-03 DIAGNOSIS — J4540 Moderate persistent asthma, uncomplicated: Secondary | ICD-10-CM | POA: Diagnosis not present

## 2019-12-03 DIAGNOSIS — I10 Essential (primary) hypertension: Secondary | ICD-10-CM | POA: Diagnosis not present

## 2019-12-03 DIAGNOSIS — G4733 Obstructive sleep apnea (adult) (pediatric): Secondary | ICD-10-CM | POA: Diagnosis not present

## 2019-12-03 NOTE — Progress Notes (Signed)
[facility] [address] [place], [place] [address]  Internal MEDICINE  Office Visit Note  Patient Name: Luis Hendrix  [id number]  [id number]  Date of Service: 12/07/2019  Chief Complaint  Patient presents with  . Hypertension    The patient is here for follow up of blood pressure. His lisinopril was increased to 20mg  daily. He has tolerated this well. Blood pressure has improved, though still slightly elevated. He states that he has tolerated the medication change well. Asthma is under control.       Current Medication: Outpatient Encounter Medications as of 12/03/2019  Medication Sig  . albuterol (PROAIR HFA) 108 (90 Base) MCG/ACT inhaler ProAir HFA 90 mcg/actuation aerosol inhaler  . budesonide-formoterol (SYMBICORT) 160-4.5 MCG/ACT inhaler Inhale 2 puffs into the lungs 2 (two) times daily.  . colchicine 0.6 MG tablet colchicine 0.6 mg tablet  . indomethacin (INDOCIN) 50 MG capsule indomethacin 50 mg capsule  . lisinopril (ZESTRIL) 20 MG tablet Take 1 tablet (20 mg total) by mouth daily.  . ondansetron (ZOFRAN-ODT) 4 MG disintegrating tablet ondansetron 4 mg disintegrating tablet  . phentermine (ADIPEX-P) 37.5 MG tablet Take 1 tablet (37.5 mg total) by mouth daily before breakfast.  . SYMBICORT 160-4.5 MCG/ACT inhaler Inhale 1 puff into the lungs 2 (two) times daily as needed.   No facility-administered encounter medications on file as of 12/03/2019.    Surgical History: Past Surgical History:  Procedure Laterality Date  . ELBOW FRACTURE SURGERY Right    Approx age 54  . LAPAROSCOPIC GASTRIC SLEEVE RESECTION      Medical History: Past Medical History:  Diagnosis Date  . Asthma   . Hypertension   . Sleep apnea     Family History: Family History  Problem Relation Age of Onset  . Hypertension Father     Social History   Socioeconomic History  . Marital status: Married    Spouse name: Not on file  . Number of children: Not on file  .  Years of education: Not on file  . Highest education level: Not on file  Occupational History  . Not on file  Tobacco Use  . Smoking status: Never Smoker  . Smokeless tobacco: Never Used  Substance and Sexual Activity  . Alcohol use: Yes    Comment: OCCASIONALLY  . Drug use: No  . Sexual activity: Yes    Partners: Female  Other Topics Concern  . Not on file  Social History Narrative  . Not on file   Social Determinants of Health   Financial Resource Strain:   . Difficulty of Paying Living Expenses:   Food Insecurity:   . Worried About 8 in the Last Year:   . Programme researcher, broadcasting/film/video in the Last Year:   Transportation Needs:   . Barista (Medical):   Freight forwarder Lack of Transportation (Non-Medical):   Physical Activity:   . Days of Exercise per Week:   . Minutes of Exercise per Session:   Stress:   . Feeling of Stress :   Social Connections:   . Frequency of Communication with Friends and Family:   . Frequency of Social Gatherings with Friends and Family:   . Attends Religious Services:   . Active Member of Clubs or Organizations:   . Attends Marland Kitchen Meetings:   Banker Marital Status:   Intimate Partner Violence:   . Fear of Current or Ex-Partner:   . Emotionally Abused:   Marland Kitchen Physically Abused:   .  Sexually Abused:       Review of Systems  Constitutional: Negative for activity change, appetite change, fatigue, fever and unexpected weight change.  HENT: Negative for congestion, facial swelling, hearing loss, postnasal drip, sinus pressure, sore throat and voice change.   Respiratory: Negative for apnea, cough, chest tightness, shortness of breath and wheezing.   Cardiovascular: Negative for chest pain and palpitations.       Improved blood pressure since his last visit.   Endocrine: Negative for cold intolerance, heat intolerance, polydipsia and polyuria.  Musculoskeletal: Negative for arthralgias, back pain, gait problem, myalgias and neck  stiffness.  Skin: Negative for color change, pallor, rash and wound.  Allergic/Immunologic: Positive for environmental allergies.  Neurological: Negative for dizziness, tremors, facial asymmetry, light-headedness, numbness and headaches.  Hematological: Negative for adenopathy.  Psychiatric/Behavioral: Negative for agitation, dysphoric mood and suicidal ideas. The patient is not nervous/anxious.     Today's Vitals   12/03/19 1037  BP: 124/90  Pulse: 68  Resp: 16  Temp: (!) 97.3 F (36.3 C)  SpO2: 100%  Weight: 282 lb 9.6 oz (128.2 kg)  Height: 6\' 1"  (1.854 m)   Body mass index is 37.28 kg/m.   Physical Exam Vitals and nursing note reviewed.  Constitutional:      Appearance: Normal appearance. He is well-developed. He is obese.  HENT:     Head: Normocephalic and atraumatic.  Eyes:     Conjunctiva/sclera: Conjunctivae normal.     Pupils: Pupils are equal, round, and reactive to light.  Neck:     Thyroid: No thyromegaly.     Vascular: No JVD.     Trachea: No tracheal deviation.  Cardiovascular:     Rate and Rhythm: Normal rate and regular rhythm.     Heart sounds: Normal heart sounds.  Pulmonary:     Effort: Pulmonary effort is normal. No respiratory distress.     Breath sounds: Normal breath sounds. No stridor. No wheezing or rales.  Chest:     Chest wall: No tenderness.  Abdominal:     General: There is no distension.     Palpations: Abdomen is soft. There is no mass.     Tenderness: There is no abdominal tenderness. There is no guarding.  Musculoskeletal:        General: Normal range of motion.     Cervical back: Normal range of motion and neck supple.  Lymphadenopathy:     Cervical: No cervical adenopathy.  Skin:    General: Skin is warm and dry.  Neurological:     Mental Status: He is alert and oriented to person, place, and time.  Psychiatric:        Mood and Affect: Mood normal.        Behavior: Behavior normal.        Thought Content: Thought content  normal.        Judgment: Judgment normal.    Assessment/Plan: 1. Essential hypertension Improved. Continue increased dose lisinopril. Monitor closely.   2. Moderate persistent asthma without complication Stable. Continue inhalers and respiratory medications as prescribed   4. Obstructive sleep apnea syndrome Continue regular visits with Dr. Devona Konig as scheduled for CPAP management.   General Counseling: zarian colpitts understanding of the findings of todays visit and agrees with plan of treatment. I have discussed any further diagnostic evaluation that may be needed or ordered today. We also reviewed his medications today. he has been encouraged to call the office with any questions or concerns that should  arise related to todays visit.   Hypertension Counseling:   The following hypertensive lifestyle modification were recommended and discussed:  1. Limiting alcohol intake to less than 1 oz/day of ethanol:(24 oz of beer or 8 oz of wine or 2 oz of 100-proof whiskey). 2. Take baby ASA 81 mg daily. 3. Importance of regular aerobic exercise and losing weight. 4. Reduce dietary saturated fat and cholesterol intake for overall cardiovascular health. 5. Maintaining adequate dietary potassium, calcium, and magnesium intake. 6. Regular monitoring of the blood pressure. 7. Reduce sodium intake to less than 100 mmol/day (less than 2.3 gm of sodium or less than 6 gm of sodium choride)   This patient was seen by Vincent Gros FNP Collaboration with Dr Lyndon Code as a part of collaborative care agreement  Total time spent: 20 Minutes   Time spent includes review of chart, medications, test results, and follow up plan with the patient.      Dr Lyndon Code Internal medicine

## 2020-01-30 ENCOUNTER — Telehealth: Payer: Self-pay

## 2020-01-30 NOTE — Telephone Encounter (Signed)
Confirmed and screened for 02-04-20 ov. 

## 2020-02-04 ENCOUNTER — Encounter: Payer: Self-pay | Admitting: Nurse Practitioner

## 2020-02-04 ENCOUNTER — Other Ambulatory Visit: Payer: Self-pay

## 2020-02-04 ENCOUNTER — Ambulatory Visit: Payer: BC Managed Care – PPO | Admitting: Nurse Practitioner

## 2020-02-04 VITALS — BP 120/86 | HR 79 | Temp 97.4°F | Resp 16 | Ht 74.0 in | Wt 280.2 lb

## 2020-02-04 DIAGNOSIS — E668 Other obesity: Secondary | ICD-10-CM

## 2020-02-04 DIAGNOSIS — J45909 Unspecified asthma, uncomplicated: Secondary | ICD-10-CM | POA: Diagnosis not present

## 2020-02-04 DIAGNOSIS — I10 Essential (primary) hypertension: Secondary | ICD-10-CM

## 2020-02-04 DIAGNOSIS — Z1211 Encounter for screening for malignant neoplasm of colon: Secondary | ICD-10-CM | POA: Insufficient documentation

## 2020-02-04 MED ORDER — PHENTERMINE HCL 37.5 MG PO TABS: 37.5000 mg | ORAL_TABLET | Freq: Every day | ORAL | 0 refills | Status: DC

## 2020-02-04 NOTE — Progress Notes (Signed)
[facility] [address] [place], [place] [address]  Internal MEDICINE  Office Visit Note  Patient Name: Luis Hendrix  [id number]  [id number]  Date of Service: 02/04/2020  Chief Complaint  Patient presents with  . Medical Management of Chronic Issues    Weight management   . Hypertension  . Asthma    The patient is here for routine follow up visit. States that asthma is well controlled. His weight has remained stable. Would like to restart phentermine. Desires to lose 10 to 15 additional pounds over the next few months. He has been on phentermine in the past and has done well on this without negative side effects His blood pressure is well controlled. He is due for colon cancer screening.       Current Medication: Outpatient Encounter Medications as of 02/04/2020  Medication Sig  . albuterol (PROAIR HFA) 108 (90 Base) MCG/ACT inhaler ProAir HFA 90 mcg/actuation aerosol inhaler  . budesonide-formoterol (SYMBICORT) 160-4.5 MCG/ACT inhaler Inhale 2 puffs into the lungs 2 (two) times daily.  . colchicine 0.6 MG tablet colchicine 0.6 mg tablet  . indomethacin (INDOCIN) 50 MG capsule indomethacin 50 mg capsule  . lisinopril (ZESTRIL) 20 MG tablet Take 1 tablet (20 mg total) by mouth daily.  . ondansetron (ZOFRAN-ODT) 4 MG disintegrating tablet ondansetron 4 mg disintegrating tablet  . phentermine (ADIPEX-P) 37.5 MG tablet Take 1 tablet (37.5 mg total) by mouth daily before breakfast.  . SYMBICORT 160-4.5 MCG/ACT inhaler Inhale 1 puff into the lungs 2 (two) times daily as needed.  . [DISCONTINUED] phentermine (ADIPEX-P) 37.5 MG tablet Take 1 tablet (37.5 mg total) by mouth daily before breakfast.   No facility-administered encounter medications on file as of 02/04/2020.    Surgical History: Past Surgical History:  Procedure Laterality Date  . ELBOW FRACTURE SURGERY Right    Approx age 81  . LAPAROSCOPIC GASTRIC SLEEVE RESECTION      Medical History: Past  Medical History:  Diagnosis Date  . Asthma   . Hypertension   . Sleep apnea     Family History: Family History  Problem Relation Age of Onset  . Hypertension Father     Social History   Socioeconomic History  . Marital status: Married    Spouse name: Not on file  . Number of children: Not on file  . Years of education: Not on file  . Highest education level: Not on file  Occupational History  . Not on file  Tobacco Use  . Smoking status: Never Smoker  . Smokeless tobacco: Never Used  Substance and Sexual Activity  . Alcohol use: Yes    Comment: OCCASIONALLY  . Drug use: No  . Sexual activity: Yes    Partners: Female  Other Topics Concern  . Not on file  Social History Narrative  . Not on file   Social Determinants of Health   Financial Resource Strain:   . Difficulty of Paying Living Expenses:   Food Insecurity:   . Worried About Programme researcher, broadcasting/film/video in the Last Year:   . Barista in the Last Year:   Transportation Needs:   . Freight forwarder (Medical):   Marland Kitchen Lack of Transportation (Non-Medical):   Physical Activity:   . Days of Exercise per Week:   . Minutes of Exercise per Session:   Stress:   . Feeling of Stress :   Social Connections:   . Frequency of Communication with Friends and Family:   .  Frequency of Social Gatherings with Friends and Family:   . Attends Religious Services:   . Active Member of Clubs or Organizations:   . Attends Banker Meetings:   Marland Kitchen Marital Status:   Intimate Partner Violence:   . Fear of Current or Ex-Partner:   . Emotionally Abused:   Marland Kitchen Physically Abused:   . Sexually Abused:       Review of Systems  Constitutional: Negative for activity change, appetite change, fatigue, fever and unexpected weight change.  HENT: Negative for congestion, facial swelling, hearing loss, postnasal drip, sinus pressure, sore throat and voice change.   Respiratory: Negative for apnea, cough, chest tightness,  shortness of breath and wheezing.   Cardiovascular: Negative for chest pain and palpitations.       Blood pressure is stable.   Gastrointestinal: Negative for constipation, diarrhea, nausea and vomiting.  Endocrine: Negative for cold intolerance, heat intolerance, polydipsia and polyuria.  Musculoskeletal: Negative for arthralgias, back pain, gait problem, myalgias and neck stiffness.  Skin: Negative for color change, pallor, rash and wound.  Allergic/Immunologic: Positive for environmental allergies.  Neurological: Negative for dizziness, tremors, facial asymmetry, light-headedness, numbness and headaches.  Hematological: Negative for adenopathy.  Psychiatric/Behavioral: Negative for agitation, dysphoric mood and suicidal ideas. The patient is not nervous/anxious.     Today's Vitals   02/04/20 0836  BP: 120/86  Pulse: 79  Resp: 16  Temp: (!) 97.4 F (36.3 C)  SpO2: 99%  Weight: 280 lb 3.2 oz (127.1 kg)  Height: 6\' 2"  (1.88 m)   Body mass index is 35.98 kg/m.  Physical Exam Vitals and nursing note reviewed.  Constitutional:      Appearance: Normal appearance. He is well-developed. He is obese.  HENT:     Head: Normocephalic and atraumatic.  Eyes:     Conjunctiva/sclera: Conjunctivae normal.     Pupils: Pupils are equal, round, and reactive to light.  Neck:     Thyroid: No thyromegaly.     Vascular: No JVD.     Trachea: No tracheal deviation.  Cardiovascular:     Rate and Rhythm: Normal rate and regular rhythm.     Heart sounds: Normal heart sounds.  Pulmonary:     Effort: Pulmonary effort is normal. No respiratory distress.     Breath sounds: Normal breath sounds. No stridor. No wheezing or rales.  Chest:     Chest wall: No tenderness.  Abdominal:     General: There is no distension.     Palpations: Abdomen is soft. There is no mass.     Tenderness: There is no abdominal tenderness. There is no guarding.  Musculoskeletal:        General: Normal range of motion.      Cervical back: Normal range of motion and neck supple.  Lymphadenopathy:     Cervical: No cervical adenopathy.  Skin:    General: Skin is warm and dry.  Neurological:     Mental Status: He is alert and oriented to person, place, and time.  Psychiatric:        Mood and Affect: Mood normal.        Behavior: Behavior normal.        Thought Content: Thought content normal.        Judgment: Judgment normal.    Assessment/Plan:  1. Essential hypertension Stable. Continue bp medication as prescribed   2. Moderate asthma without complication, unspecified whether persistent Continue inhalers as prescribed   3. Moderate obesity Restart phentermine daily.  Limit calorie intake to 1500 calories per day. Incorporate exercise into daily routine.  - phentermine (ADIPEX-P) 37.5 MG tablet; Take 1 tablet (37.5 mg total) by mouth daily before breakfast.  Dispense: 30 tablet; Refill: 0  4. Screening for colon cancer Refer to GI for screening colonoscopy. - Ambulatory referral to Gastroenterology   General Counseling: irl bodie understanding of the findings of todays visit and agrees with plan of treatment. I have discussed any further diagnostic evaluation that may be needed or ordered today. We also reviewed his medications today. he has been encouraged to call the office with any questions or concerns that should arise related to todays visit.   There is a liability release in patients' chart. There has been a 10 minute discussion about the side effects including but not limited to elevated blood pressure, anxiety, lack of sleep and dry mouth. Pt understands and will like to start/continue on appetite suppressant at this time. There will be one month RX given at the time of visit with proper follow up. Nova diet plan with restricted calories is given to the pt. Pt understands and agrees with  plan of treatment  This patient was seen by Vincent Gros FNP Collaboration with Dr Lyndon Code as a part of collaborative care agreement  Orders Placed This Encounter  Procedures  . Ambulatory referral to Gastroenterology    Meds ordered this encounter  Medications  . phentermine (ADIPEX-P) 37.5 MG tablet    Sig: Take 1 tablet (37.5 mg total) by mouth daily before breakfast.    Dispense:  30 tablet    Refill:  0    Order Specific Question:   Supervising Provider    Answer:   Lyndon Code [[id number]]    Total time spent: 30 Minutes   Time spent includes review of chart, medications, test results, and follow up plan with the patient.      Dr Lyndon Code Internal medicine

## 2020-02-13 ENCOUNTER — Telehealth (INDEPENDENT_AMBULATORY_CARE_PROVIDER_SITE_OTHER): Payer: Self-pay | Admitting: Gastroenterology

## 2020-02-13 ENCOUNTER — Other Ambulatory Visit: Payer: Self-pay

## 2020-02-13 DIAGNOSIS — Z1211 Encounter for screening for malignant neoplasm of colon: Secondary | ICD-10-CM

## 2020-02-13 MED ORDER — PEG 3350-KCL-NA BICARB-NACL 420 G PO SOLR: 4000.0000 mL | Freq: Once | ORAL | 0 refills | Status: AC

## 2020-02-13 NOTE — Progress Notes (Signed)
Gastroenterology Pre-Procedure Review  Request Date: Wed 02/26/20 Requesting Physician: Dr. Allegra Lai  PATIENT REVIEW QUESTIONS: The patient responded to the following health history questions as indicated:    1. Are you having any GI issues? no 2. Do you have a personal history of Polyps? no 3. Do you have a family history of Colon Cancer or Polyps? no 4. Diabetes Mellitus? no 5. Joint replacements in the past 12 months?no 6. Major health problems in the past 3 months?no 7. Any artificial heart valves, MVP, or defibrillator?no    MEDICATIONS & ALLERGIES:    Patient reports the following regarding taking any anticoagulation/antiplatelet therapy:   Plavix, Coumadin, Eliquis, Xarelto, Lovenox, Pradaxa, Brilinta, or Effient? no Aspirin? no  Patient confirms/reports the following medications:  Current Outpatient Medications  Medication Sig Dispense Refill  . albuterol (PROAIR HFA) 108 (90 Base) MCG/ACT inhaler ProAir HFA 90 mcg/actuation aerosol inhaler 18 g 5  . budesonide-formoterol (SYMBICORT) 160-4.5 MCG/ACT inhaler Inhale 2 puffs into the lungs 2 (two) times daily. 1 Inhaler 5  . colchicine 0.6 MG tablet colchicine 0.6 mg tablet    . indomethacin (INDOCIN) 50 MG capsule indomethacin 50 mg capsule    . lisinopril (ZESTRIL) 20 MG tablet Take 1 tablet (20 mg total) by mouth daily. 90 tablet 3  . ondansetron (ZOFRAN-ODT) 4 MG disintegrating tablet ondansetron 4 mg disintegrating tablet    . phentermine (ADIPEX-P) 37.5 MG tablet Take 1 tablet (37.5 mg total) by mouth daily before breakfast. 30 tablet 0  . SYMBICORT 160-4.5 MCG/ACT inhaler Inhale 1 puff into the lungs 2 (two) times daily as needed. 1 Inhaler 11  . polyethylene glycol-electrolytes (NULYTELY) 420 g solution Take 4,000 mLs by mouth once for 1 dose. Follow preparation instructions.  Begin drinking 8oz q30 mins, the evening before procedure between 5pm-6pm until completed the entire contents.  Do Not Eat or Drink Anything 4 hours  prior to colonoscopy time. 4000 mL 0   No current facility-administered medications for this visit.    Patient confirms/reports the following allergies:  No Known Allergies  No orders of the defined types were placed in this encounter.   AUTHORIZATION INFORMATION Primary Insurance: 1D#: Group #:  Secondary Insurance: 1D#: Group #:  SCHEDULE INFORMATION: Date: Wed 02/26/20 Time: Location:[facility]

## 2020-02-24 ENCOUNTER — Other Ambulatory Visit
Admission: RE | Admit: 2020-02-24 | Discharge: 2020-02-24 | Disposition: A | Discharge status: Home / Self Care | Payer: BC Managed Care – PPO | Source: Ambulatory Visit | Attending: Gastroenterology | Admitting: Gastroenterology

## 2020-02-24 ENCOUNTER — Other Ambulatory Visit: Payer: Self-pay

## 2020-02-24 DIAGNOSIS — Z20822 Contact with and (suspected) exposure to covid-19: Secondary | ICD-10-CM | POA: Insufficient documentation

## 2020-02-24 DIAGNOSIS — Z01812 Encounter for preprocedural laboratory examination: Secondary | ICD-10-CM | POA: Insufficient documentation

## 2020-02-24 LAB — SARS CORONAVIRUS 2 (TAT 6-24 HRS): SARS Coronavirus 2: NEGATIVE

## 2020-02-26 ENCOUNTER — Ambulatory Visit
Admission: RE | Admit: 2020-02-26 | Discharge: 2020-02-26 | Disposition: A | Discharge status: Home / Self Care | Payer: BC Managed Care – PPO | Attending: Gastroenterology | Admitting: Gastroenterology

## 2020-02-26 ENCOUNTER — Encounter: Payer: Self-pay | Admitting: Gastroenterology

## 2020-02-26 ENCOUNTER — Ambulatory Visit: Payer: BC Managed Care – PPO | Admitting: Certified Registered"

## 2020-02-26 ENCOUNTER — Encounter
Admission: RE | Disposition: A | Discharge status: Home / Self Care | Payer: Self-pay | Source: Home / Self Care | Attending: Gastroenterology

## 2020-02-26 DIAGNOSIS — J45909 Unspecified asthma, uncomplicated: Secondary | ICD-10-CM | POA: Diagnosis not present

## 2020-02-26 DIAGNOSIS — Z7951 Long term (current) use of inhaled steroids: Secondary | ICD-10-CM | POA: Insufficient documentation

## 2020-02-26 DIAGNOSIS — Z1211 Encounter for screening for malignant neoplasm of colon: Secondary | ICD-10-CM | POA: Diagnosis not present

## 2020-02-26 DIAGNOSIS — G4730 Sleep apnea, unspecified: Secondary | ICD-10-CM | POA: Diagnosis not present

## 2020-02-26 DIAGNOSIS — I10 Essential (primary) hypertension: Secondary | ICD-10-CM | POA: Diagnosis not present

## 2020-02-26 DIAGNOSIS — Z79899 Other long term (current) drug therapy: Secondary | ICD-10-CM | POA: Insufficient documentation

## 2020-02-26 HISTORY — PX: COLONOSCOPY WITH PROPOFOL: SHX5780

## 2020-02-26 SURGERY — COLONOSCOPY WITH PROPOFOL
Anesthesia: General

## 2020-02-26 MED ORDER — MIDAZOLAM HCL 2 MG/2ML IJ SOLN
INTRAMUSCULAR | Status: AC
  Filled 2020-02-26: qty 2

## 2020-02-26 MED ORDER — PROPOFOL 500 MG/50ML IV EMUL
INTRAVENOUS | Status: AC
  Filled 2020-02-26: qty 50

## 2020-02-26 MED ORDER — SODIUM CHLORIDE 0.9 % IV SOLN
INTRAVENOUS | Status: DC
  Administered 2020-02-26: 1000 mL via INTRAVENOUS

## 2020-02-26 MED ORDER — PROPOFOL 500 MG/50ML IV EMUL
INTRAVENOUS | Status: DC | PRN
  Administered 2020-02-26: 150 ug/kg/min via INTRAVENOUS

## 2020-02-26 MED ORDER — PROPOFOL 10 MG/ML IV BOLUS
INTRAVENOUS | Status: DC | PRN
  Administered 2020-02-26 (×2): 50 mg via INTRAVENOUS

## 2020-02-26 MED ORDER — MIDAZOLAM HCL 2 MG/2ML IJ SOLN
INTRAMUSCULAR | Status: DC | PRN
  Administered 2020-02-26: 2 mg via INTRAVENOUS

## 2020-02-26 NOTE — Anesthesia Postprocedure Evaluation (Signed)
Anesthesia Post Note  Patient: Luis Hendrix.  Procedure(s) Performed: COLONOSCOPY WITH PROPOFOL (N/A )  Patient location during evaluation: Endoscopy Anesthesia Type: General Level of consciousness: awake and alert and oriented Pain management: pain level controlled Vital Signs Assessment: post-procedure vital signs reviewed and stable Respiratory status: spontaneous breathing, nonlabored ventilation and respiratory function stable Cardiovascular status: blood pressure returned to baseline and stable Postop Assessment: no signs of nausea or vomiting Anesthetic complications: no   No complications documented.   Last Vitals:  Vitals:   02/26/20 0950 02/26/20 1000  BP: 111/77 115/78  Pulse: 68   Resp: 20 20  Temp:    SpO2: 100% 100%    Last Pain:  Vitals:   02/26/20 0930  TempSrc: Temporal  PainSc:                  Philander Ake

## 2020-02-26 NOTE — Op Note (Signed)
[facility] Gastroenterology Patient Name: Luis Hendrix Procedure Date: 02/26/2020 9:01 AM MRN: [id number] Account #: [id number] Date of Birth: 22-May-1966 Admit Type: Outpatient Age: 54 Room: [facility] ENDO ROOM 4 Gender: Male Note Status: Finalized Procedure:             Colonoscopy Indications:           Screening for colorectal malignant neoplasm, This is                         the patient's first colonoscopy Providers:             Lin Landsman MD, MD Medicines:             Monitored Anesthesia Care Complications:         No immediate complications. Estimated blood loss: None. Procedure:             Pre-Anesthesia Assessment:                        - Prior to the procedure, a History and Physical was                         performed, and patient medications and allergies were                         reviewed. The patient is competent. The risks and                         benefits of the procedure and the sedation options and                         risks were discussed with the patient. All questions                         were answered and informed consent was obtained.                         Patient identification and proposed procedure were                         verified by the physician, the nurse, the                         anesthesiologist, the anesthetist and the technician                         in the pre-procedure area in the procedure room in the                         endoscopy suite. Mental Status Examination: alert and                         oriented. Airway Examination: normal oropharyngeal                         airway and neck mobility. Respiratory Examination:                         clear to auscultation. CV Examination: normal.  Prophylactic Antibiotics: The patient does not require                         prophylactic antibiotics. Prior Anticoagulants: The                         patient has taken no  previous anticoagulant or                         antiplatelet agents. ASA Grade Assessment: II - A                         patient with mild systemic disease. After reviewing                         the risks and benefits, the patient was deemed in                         satisfactory condition to undergo the procedure. The                         anesthesia plan was to use monitored anesthesia care                         (MAC). Immediately prior to administration of                         medications, the patient was re-assessed for adequacy                         to receive sedatives. The heart rate, respiratory                         rate, oxygen saturations, blood pressure, adequacy of                         pulmonary ventilation, and response to care were                         monitored throughout the procedure. The physical                         status of the patient was re-assessed after the                         procedure.                        After obtaining informed consent, the colonoscope was                         passed under direct vision. Throughout the procedure,                         the patient's blood pressure, pulse, and oxygen                         saturations were monitored continuously. The  Colonoscope was introduced through the anus and                         advanced to the the cecum, identified by appendiceal                         orifice and ileocecal valve. The colonoscopy was                         technically difficult and complex due to significant                         looping and the patient's body habitus. Successful                         completion of the procedure was aided by changing the                         patient to a prone position. Findings:      The perianal and digital rectal examinations were normal. Pertinent       negatives include normal sphincter tone and no palpable rectal lesions.       The entire examined colon appeared normal.      The retroflexed view of the distal rectum and anal verge was normal and       showed no anal or rectal abnormalities. Impression:            - The entire examined colon is normal.                        - The distal rectum and anal verge are normal on                         retroflexion view.                        - No specimens collected. Recommendation:        - Discharge patient to home (with escort).                        - Resume regular diet today.                        - Continue present medications.                        - Repeat colonoscopy in 10 years for screening                         purposes. Procedure Code(s):     --- Professional ---                        G8366, Colorectal cancer screening; colonoscopy on                         individual not meeting criteria for high risk Diagnosis Code(s):     --- Professional ---                        Z12.11,  Encounter for screening for malignant neoplasm                         of colon CPT copyright 2019 American Medical Association. All rights reserved. The codes documented in this report are preliminary and upon coder review may  be revised to meet current compliance requirements. Dr. Ulyess Mort Lin Landsman MD, MD 02/26/2020 9:32:10 AM This report has been signed electronically. Number of Addenda: 0 Note Initiated On: 02/26/2020 9:01 AM Scope Withdrawal Time: 0 hours 11 minutes 34 seconds  Total Procedure Duration: 0 hours 17 minutes 58 seconds  Estimated Blood Loss:  Estimated blood loss: none.      [facility]

## 2020-02-26 NOTE — Transfer of Care (Signed)
Immediate Anesthesia Transfer of Care Note  Patient: Luis Hendrix.  Procedure(s) Performed: COLONOSCOPY WITH PROPOFOL (N/A )  Patient Location: PACU and Endoscopy Unit  Anesthesia Type:General  Level of Consciousness: drowsy  Airway & Oxygen Therapy: Patient Spontanous Breathing  Post-op Assessment: Report given to RN  Post vital signs: stable  Last Vitals:  Vitals Value Taken Time  BP    Temp    Pulse    Resp    SpO2      Last Pain:  Vitals:   02/26/20 0828  TempSrc: Temporal  PainSc: 0-No pain         Complications: No complications documented.

## 2020-02-26 NOTE — Anesthesia Preprocedure Evaluation (Signed)
Anesthesia Evaluation  Patient identified by MRN, date of birth, ID band Patient awake    Reviewed: Allergy & Precautions, NPO status , Patient's Chart, lab work & pertinent test results  History of Anesthesia Complications Negative for: history of anesthetic complications  Airway Mallampati: II  TM Distance: >3 FB Neck ROM: Full    Dental no notable dental hx.    Pulmonary asthma (mild intermittent) , sleep apnea ,    breath sounds clear to auscultation- rhonchi (-) wheezing      Cardiovascular hypertension, Pt. on medications (-) CAD, (-) Past MI, (-) Cardiac Stents and (-) CABG  Rhythm:Regular Rate:Normal - Systolic murmurs and - Diastolic murmurs    Neuro/Psych neg Seizures negative neurological ROS  negative psych ROS   GI/Hepatic negative GI ROS, Neg liver ROS,   Endo/Other  negative endocrine ROSneg diabetes  Renal/GU negative Renal ROS     Musculoskeletal negative musculoskeletal ROS (+)   Abdominal (+) + obese,   Peds  Hematology negative hematology ROS (+)   Anesthesia Other Findings Past Medical History: No date: Asthma No date: Hypertension No date: Sleep apnea   Reproductive/Obstetrics                             Anesthesia Physical Anesthesia Plan  ASA: II  Anesthesia Plan: General   Post-op Pain Management:    Induction: Intravenous  PONV Risk Score and Plan: 1 and Propofol infusion  Airway Management Planned: Natural Airway  Additional Equipment:   Intra-op Plan:   Post-operative Plan:   Informed Consent: I have reviewed the patients History and Physical, chart, labs and discussed the procedure including the risks, benefits and alternatives for the proposed anesthesia with the patient or authorized representative who has indicated his/her understanding and acceptance.     Dental advisory given  Plan Discussed with: CRNA and  Anesthesiologist  Anesthesia Plan Comments:         Anesthesia Quick Evaluation

## 2020-02-26 NOTE — H&P (Signed)
Arlyss Repress, MD [address]  [address]  [place], [place] [address]  Main: [phone]  Fax: [phone] Pager: [phone]  Primary Care Physician:  Lyndon Code, MD Primary Gastroenterologist:  Dr. Arlyss Repress  Pre-Procedure History & Physical: HPI:  Luis Hendrix. is a 54 y.o. male is here for an colonoscopy.   Past Medical History:  Diagnosis Date  . Asthma   . Hypertension   . Sleep apnea     Past Surgical History:  Procedure Laterality Date  . DIAGNOSTIC LAPAROSCOPY    . ELBOW FRACTURE SURGERY Right    Approx age 86  . FRACTURE SURGERY    . LAPAROSCOPIC GASTRIC SLEEVE RESECTION      Prior to Admission medications   Medication Sig Start Date End Date Taking? Authorizing Provider  lisinopril (ZESTRIL) 20 MG tablet Take 1 tablet (20 mg total) by mouth daily. 10/24/19  Yes Johnna Acosta, NP  phentermine (ADIPEX-P) 37.5 MG tablet Take 1 tablet (37.5 mg total) by mouth daily before breakfast. 02/04/20  Yes Carlean Jews, NP  albuterol (PROAIR HFA) 108 (90 Base) MCG/ACT inhaler ProAir HFA 90 mcg/actuation aerosol inhaler 06/17/19   Boscia, Kathlynn Grate, NP  budesonide-formoterol (SYMBICORT) 160-4.5 MCG/ACT inhaler Inhale 2 puffs into the lungs 2 (two) times daily. 06/17/19   Carlean Jews, NP  colchicine 0.6 MG tablet colchicine 0.6 mg tablet Patient not taking: Reported on 02/26/2020    [provider]  indomethacin (INDOCIN) 50 MG capsule indomethacin 50 mg capsule Patient not taking: Reported on 02/26/2020    [provider]  ondansetron (ZOFRAN-ODT) 4 MG disintegrating tablet ondansetron 4 mg disintegrating tablet Patient not taking: Reported on 02/26/2020    [provider]  SYMBICORT 160-4.5 MCG/ACT inhaler Inhale 1 puff into the lungs 2 (two) times daily as needed. 04/13/18   Carlean Jews, NP    Allergies as of 02/13/2020  . (No Known Allergies)    Family History  Problem Relation Age of Onset  .  Hypertension Father     Social History   Socioeconomic History  . Marital status: Married    Spouse name: Not on file  . Number of children: Not on file  . Years of education: Not on file  . Highest education level: Not on file  Occupational History  . Not on file  Tobacco Use  . Smoking status: Never Smoker  . Smokeless tobacco: Never Used  Vaping Use  . Vaping Use: Never used  Substance and Sexual Activity  . Alcohol use: Yes    Comment: OCCASIONALLY  . Drug use: No  . Sexual activity: Yes    Partners: Female  Other Topics Concern  . Not on file  Social History Narrative  . Not on file   Social Determinants of Health   Financial Resource Strain:   . Difficulty of Paying Living Expenses:   Food Insecurity:   . Worried About Programme researcher, broadcasting/film/video in the Last Year:   . Barista in the Last Year:   Transportation Needs:   . Freight forwarder (Medical):   Marland Kitchen Lack of Transportation (Non-Medical):   Physical Activity:   . Days of Exercise per Week:   . Minutes of Exercise per Session:   Stress:   . Feeling of Stress :   Social Connections:   . Frequency of Communication with Friends and Family:   . Frequency of Social Gatherings with Friends and Family:   .  Attends Religious Services:   . Active Member of Clubs or Organizations:   . Attends Banker Meetings:   Marland Kitchen Marital Status:   Intimate Partner Violence:   . Fear of Current or Ex-Partner:   . Emotionally Abused:   Marland Kitchen Physically Abused:   . Sexually Abused:     Review of Systems: See HPI, otherwise negative ROS  Physical Exam: BP 127/83   Pulse 81   Temp 97.7 F (36.5 C) (Temporal)   Resp 18   Ht 6\' 1"  (1.854 m)   Wt 125 kg   SpO2 100%   BMI 36.36 kg/m  General:   Alert,  pleasant and cooperative in NAD Head:  Normocephalic and atraumatic. Neck:  Supple; no masses or thyromegaly. Lungs:  Clear throughout to auscultation.    Heart:  Regular rate and rhythm. Abdomen:  Soft,  nontender and nondistended. Normal bowel sounds, without guarding, and without rebound.   Neurologic:  Alert and  oriented x4;  grossly normal neurologically.  Impression/Plan: . is here for an colonoscopy to be performed for colon cancer screening  Risks, benefits, limitations, and alternatives regarding  colonoscopy have been reviewed with the patient.  Questions have been answered.  All parties agreeable.   [name], MD  02/26/2020, 9:03 AM

## 2020-02-27 ENCOUNTER — Encounter: Payer: Self-pay | Admitting: Gastroenterology

## 2020-03-03 ENCOUNTER — Telehealth: Payer: Self-pay

## 2020-03-03 NOTE — Telephone Encounter (Signed)
Lmom to confirm and screen for 03-05-20 ov. °

## 2020-03-05 ENCOUNTER — Ambulatory Visit (INDEPENDENT_AMBULATORY_CARE_PROVIDER_SITE_OTHER): Payer: BC Managed Care – PPO | Admitting: Nurse Practitioner

## 2020-03-05 ENCOUNTER — Other Ambulatory Visit: Payer: Self-pay

## 2020-03-05 ENCOUNTER — Encounter: Payer: Self-pay | Admitting: Nurse Practitioner

## 2020-03-05 VITALS — BP 130/80 | HR 83 | Temp 97.5°F | Resp 16 | Ht 74.0 in | Wt 276.2 lb

## 2020-03-05 DIAGNOSIS — J4540 Moderate persistent asthma, uncomplicated: Secondary | ICD-10-CM | POA: Diagnosis not present

## 2020-03-05 DIAGNOSIS — G4733 Obstructive sleep apnea (adult) (pediatric): Secondary | ICD-10-CM | POA: Diagnosis not present

## 2020-03-05 DIAGNOSIS — I10 Essential (primary) hypertension: Secondary | ICD-10-CM

## 2020-03-05 DIAGNOSIS — E668 Other obesity: Secondary | ICD-10-CM | POA: Diagnosis not present

## 2020-03-05 MED ORDER — PHENTERMINE HCL 37.5 MG PO TABS: 37.5000 mg | ORAL_TABLET | Freq: Every day | ORAL | 0 refills | Status: DC

## 2020-03-05 NOTE — Progress Notes (Signed)
[facility] [address] [place], [place] [address]  Internal MEDICINE  Office Visit Note  Patient Name: Luis Hendrix  [id number]  [id number]  Date of Service: 03/05/2020  Chief Complaint  Patient presents with  . Follow-up  . Sleep Apnea  . Hypertension  . Quality Metric Gaps    HIV screening, TDAP    The patient is here for follow up weight management. He was restarted on phentermine at his last visit. Has done well. Denies negative side effects. He has lost 5 pounds since his last visit. His blood pressure is well managed and his asthma is well controlled. He did have screening colonoscopy earlier this month. It was normal. Repeat is to be done in one year.       Current Medication: Outpatient Encounter Medications as of 03/05/2020  Medication Sig  . albuterol (PROAIR HFA) 108 (90 Base) MCG/ACT inhaler ProAir HFA 90 mcg/actuation aerosol inhaler  . budesonide-formoterol (SYMBICORT) 160-4.5 MCG/ACT inhaler Inhale 2 puffs into the lungs 2 (two) times daily.  Marland Kitchen lisinopril (ZESTRIL) 20 MG tablet Take 1 tablet (20 mg total) by mouth daily.  . phentermine (ADIPEX-P) 37.5 MG tablet Take 1 tablet (37.5 mg total) by mouth daily before breakfast.  . SYMBICORT 160-4.5 MCG/ACT inhaler Inhale 1 puff into the lungs 2 (two) times daily as needed.  . [DISCONTINUED] phentermine (ADIPEX-P) 37.5 MG tablet Take 1 tablet (37.5 mg total) by mouth daily before breakfast.   No facility-administered encounter medications on file as of 03/05/2020.    Surgical History: Past Surgical History:  Procedure Laterality Date  . COLONOSCOPY WITH PROPOFOL N/A 02/26/2020   Procedure: COLONOSCOPY WITH PROPOFOL;  Surgeon: Toney Reil, MD;  Location: [facility] ENDOSCOPY;  Service: Gastroenterology;  Laterality: N/A;  . DIAGNOSTIC LAPAROSCOPY    . ELBOW FRACTURE SURGERY Right    Approx age 36  . FRACTURE SURGERY    . LAPAROSCOPIC GASTRIC SLEEVE RESECTION      Medical History: Past  Medical History:  Diagnosis Date  . Asthma   . Hypertension   . Sleep apnea     Family History: Family History  Problem Relation Age of Onset  . Hypertension Father     Social History   Socioeconomic History  . Marital status: Married    Spouse name: Not on file  . Number of children: Not on file  . Years of education: Not on file  . Highest education level: Not on file  Occupational History  . Not on file  Tobacco Use  . Smoking status: Never Smoker  . Smokeless tobacco: Never Used  Vaping Use  . Vaping Use: Never used  Substance and Sexual Activity  . Alcohol use: Yes    Comment: OCCASIONALLY  . Drug use: No  . Sexual activity: Yes    Partners: Female  Other Topics Concern  . Not on file  Social History Narrative  . Not on file   Social Determinants of Health   Financial Resource Strain:   . Difficulty of Paying Living Expenses: Not on file  Food Insecurity:   . Worried About Programme researcher, broadcasting/film/video in the Last Year: Not on file  . Ran Out of Food in the Last Year: Not on file  Transportation Needs:   . Lack of Transportation (Medical): Not on file  . Lack of Transportation (Non-Medical): Not on file  Physical Activity:   . Days of Exercise per Week: Not on file  . Minutes of Exercise per Session:  Not on file  Stress:   . Feeling of Stress : Not on file  Social Connections:   . Frequency of Communication with Friends and Family: Not on file  . Frequency of Social Gatherings with Friends and Family: Not on file  . Attends Religious Services: Not on file  . Active Member of Clubs or Organizations: Not on file  . Attends Banker Meetings: Not on file  . Marital Status: Not on file  Intimate Partner Violence:   . Fear of Current or Ex-Partner: Not on file  . Emotionally Abused: Not on file  . Physically Abused: Not on file  . Sexually Abused: Not on file      Review of Systems  Constitutional: Negative for activity change, appetite change,  fatigue, fever and unexpected weight change.       Five pound weight loss since his last visit  HENT: Negative for congestion, facial swelling, hearing loss, postnasal drip, sinus pressure, sore throat and voice change.   Respiratory: Negative for apnea, cough, chest tightness, shortness of breath and wheezing.   Cardiovascular: Negative for chest pain and palpitations.       Blood pressure is stable.   Gastrointestinal: Negative for constipation, diarrhea, nausea and vomiting.  Endocrine: Negative for cold intolerance, heat intolerance, polydipsia and polyuria.  Musculoskeletal: Negative for arthralgias, back pain, gait problem, myalgias and neck stiffness.  Skin: Negative for color change, pallor, rash and wound.  Allergic/Immunologic: Positive for environmental allergies.  Neurological: Negative for dizziness, tremors, facial asymmetry, light-headedness, numbness and headaches.  Hematological: Negative for adenopathy.  Psychiatric/Behavioral: Negative for agitation, dysphoric mood and suicidal ideas. The patient is not nervous/anxious.     Today's Vitals   03/05/20 0846  BP: 130/80  Pulse: 83  Resp: 16  Temp: (!) 97.5 F (36.4 C)  SpO2: 99%  Weight: 276 lb 3.2 oz (125.3 kg)  Height: 6\' 2"  (1.88 m)   Body mass index is 35.46 kg/m.  Physical Exam Vitals and nursing note reviewed.  Constitutional:      Appearance: Normal appearance. He is well-developed. He is obese.  HENT:     Head: Normocephalic and atraumatic.     Nose: Nose normal.  Eyes:     Conjunctiva/sclera: Conjunctivae normal.     Pupils: Pupils are equal, round, and reactive to light.  Neck:     Thyroid: No thyromegaly.     Vascular: No JVD.     Trachea: No tracheal deviation.  Cardiovascular:     Rate and Rhythm: Normal rate and regular rhythm.     Heart sounds: Normal heart sounds.  Pulmonary:     Effort: Pulmonary effort is normal. No respiratory distress.     Breath sounds: Normal breath sounds. No  stridor. No wheezing or rales.  Chest:     Chest wall: No tenderness.  Abdominal:     General: There is no distension.     Palpations: Abdomen is soft. There is no mass.     Tenderness: There is no abdominal tenderness. There is no guarding.  Musculoskeletal:        General: Normal range of motion.     Cervical back: Normal range of motion and neck supple.  Lymphadenopathy:     Cervical: No cervical adenopathy.  Skin:    General: Skin is warm and dry.  Neurological:     Mental Status: He is alert and oriented to person, place, and time.  Psychiatric:        Mood  and Affect: Mood normal.        Behavior: Behavior normal.        Thought Content: Thought content normal.        Judgment: Judgment normal.   Assessment/Plan: 1. Essential hypertension Stable. contineu bp medication as prescribed   2. Moderate persistent asthma without complication Stable. Continue inhalers as prescribed  3. Obstructive sleep apnea syndrome Continue regular visits with Dr. Freda Munro for CPAP managment  4. Moderate obesity Improving. conitnue phentermine daily. Limit calorie intake to 1500 calories per day and incorporate exercise into daily routine.  - phentermine (ADIPEX-P) 37.5 MG tablet; Take 1 tablet (37.5 mg total) by mouth daily before breakfast.  Dispense: 30 tablet; Refill: 0  General Counseling: dakota vanwart understanding of the findings of todays visit and agrees with plan of treatment. I have discussed any further diagnostic evaluation that may be needed or ordered today. We also reviewed his medications today. he has been encouraged to call the office with any questions or concerns that should arise related to todays visit.   There is a liability release in patients' chart. There has been a 10 minute discussion about the side effects including but not limited to elevated blood pressure, anxiety, lack of sleep and dry mouth. Pt understands and will like to start/continue on appetite  suppressant at this time. There will be one month RX given at the time of visit with proper follow up. Nova diet plan with restricted calories is given to the pt. Pt understands and agrees with  plan of treatment  This patient was seen by Vincent Gros FNP Collaboration with Dr Lyndon Code as a part of collaborative care agreement   Meds ordered this encounter  Medications  . phentermine (ADIPEX-P) 37.5 MG tablet    Sig: Take 1 tablet (37.5 mg total) by mouth daily before breakfast.    Dispense:  30 tablet    Refill:  0    Order Specific Question:   Supervising Provider    Answer:   Lyndon Code [[id number]]    Total time spent: 25 Minutes  Time spent includes review of chart, medications, test results, and follow up plan with the patient.      Dr Lyndon Code Internal medicine

## 2020-03-09 ENCOUNTER — Ambulatory Visit: Payer: BC Managed Care – PPO | Admitting: Internal Medicine

## 2020-03-09 ENCOUNTER — Other Ambulatory Visit: Payer: Self-pay

## 2020-03-09 ENCOUNTER — Encounter: Payer: Self-pay | Admitting: Internal Medicine

## 2020-03-09 VITALS — BP 124/82 | HR 81 | Temp 97.2°F | Resp 16 | Ht 73.0 in | Wt 274.2 lb

## 2020-03-09 DIAGNOSIS — I10 Essential (primary) hypertension: Secondary | ICD-10-CM

## 2020-03-09 DIAGNOSIS — J4540 Moderate persistent asthma, uncomplicated: Secondary | ICD-10-CM | POA: Diagnosis not present

## 2020-03-09 DIAGNOSIS — K219 Gastro-esophageal reflux disease without esophagitis: Secondary | ICD-10-CM

## 2020-03-09 DIAGNOSIS — G4733 Obstructive sleep apnea (adult) (pediatric): Secondary | ICD-10-CM

## 2020-03-09 NOTE — Progress Notes (Signed)
[facility] [address] [place], [place] [address]  Pulmonary Sleep Medicine   Office Visit Note  Patient Name: Luis Hendrix. DOB: [birth year] MRN [id number]  Date of Service: 03/09/2020  Complaints/HPI: Pt is here for pulm follow up. 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  He has a history of asthma and is using Symbicort and albuterol as needed.  Rarely uses his inhalers. His GERD is well controlled.  He does not use a cpap since gastric bypass.   ROS  General: (-) fever, (-) chills, (-) night sweats, (-) weakness Skin: (-) rashes, (-) itching,. Eyes: (-) visual changes, (-) redness, (-) itching. Nose and Sinuses: (-) nasal stuffiness or itchiness, (-) postnasal drip, (-) nosebleeds, (-) sinus trouble. Mouth and Throat: (-) sore throat, (-) hoarseness. Neck: (-) swollen glands, (-) enlarged thyroid, (-) neck pain. Respiratory: - cough, (-) bloody sputum, - shortness of breath, - wheezing. Cardiovascular: - ankle swelling, (-) chest pain. Lymphatic: (-) lymph node enlargement. Neurologic: (-) numbness, (-) tingling. Psychiatric: (-) anxiety, (-) depression   Current Medication: Outpatient Encounter Medications as of 03/09/2020  Medication Sig   albuterol (PROAIR HFA) 108 (90 Base) MCG/ACT inhaler ProAir HFA 90 mcg/actuation aerosol inhaler   budesonide-formoterol (SYMBICORT) 160-4.5 MCG/ACT inhaler Inhale 2 puffs into the lungs 2 (two) times daily.   lisinopril (ZESTRIL) 20 MG tablet Take 1 tablet (20 mg total) by mouth daily.   phentermine (ADIPEX-P) 37.5 MG tablet Take 1 tablet (37.5 mg total) by mouth daily before breakfast.   SYMBICORT 160-4.5 MCG/ACT inhaler Inhale 1 puff into the lungs 2 (two) times daily as needed.   No facility-administered encounter medications on file as of 03/09/2020.    Surgical History: Past Surgical History:  Procedure Laterality Date   COLONOSCOPY WITH PROPOFOL N/A 02/26/2020   Procedure: COLONOSCOPY WITH PROPOFOL;   Surgeon: Toney Reil, MD;  Location: [facility] ENDOSCOPY;  Service: Gastroenterology;  Laterality: N/A;   DIAGNOSTIC LAPAROSCOPY     ELBOW FRACTURE SURGERY Right    Approx age 70   FRACTURE SURGERY     LAPAROSCOPIC GASTRIC SLEEVE RESECTION      Medical History: Past Medical History:  Diagnosis Date   Asthma    Hypertension    Sleep apnea     Family History: Family History  Problem Relation Age of Onset   Hypertension Father     Social History: Social History   Socioeconomic History   Marital status: Married    Spouse name: Not on file   Number of children: Not on file   Years of education: Not on file   Highest education level: Not on file  Occupational History   Not on file  Tobacco Use   Smoking status: Never Smoker   Smokeless tobacco: Never Used  Vaping Use   Vaping Use: Never used  Substance and Sexual Activity   Alcohol use: Yes    Comment: OCCASIONALLY   Drug use: No   Sexual activity: Yes    Partners: Female  Other Topics Concern   Not on file  Social History Narrative   Not on file   Social Determinants of Health   Financial Resource Strain:    Difficulty of Paying Living Expenses: Not on file  Food Insecurity:    Worried About Programme researcher, broadcasting/film/video in the Last Year: Not on file   [employer] of Food in the Last Year: Not on file  Transportation Needs:    Lack of Transportation (Medical): Not on  file   Lack of Transportation (Non-Medical): Not on file  Physical Activity:    Days of Exercise per Week: Not on file   Minutes of Exercise per Session: Not on file  Stress:    Feeling of Stress : Not on file  Social Connections:    Frequency of Communication with Friends and Family: Not on file   Frequency of Social Gatherings with Friends and Family: Not on file   Attends Religious Services: Not on file   Active Member of Clubs or Organizations: Not on file   Attends Banker Meetings: Not on file    Marital Status: Not on file  Intimate Partner Violence:    Fear of Current or Ex-Partner: Not on file   Emotionally Abused: Not on file   Physically Abused: Not on file   Sexually Abused: Not on file    Vital Signs: Blood pressure (!) 126/92, pulse 81, temperature (!) 97.2 F (36.2 C), resp. rate 16, height 6\' 1"  (1.854 m), weight 274 lb 3.2 oz (124.4 kg), SpO2 98 %.  Examination: General Appearance: The patient is well-developed, well-nourished, and in no distress. Skin: Gross inspection of skin unremarkable. Head: normocephalic, no gross deformities. Eyes: no gross deformities noted. ENT: ears appear grossly normal no exudates. Neck: Supple. No thyromegaly. No LAD. Respiratory: clear bilaterally. Cardiovascular: Normal S1 and S2 without murmur or rub. Extremities: No cyanosis. pulses are equal. Neurologic: Alert and oriented. No involuntary movements.  LABS: Recent Results (from the past 2160 hour(s))  SARS CORONAVIRUS 2 (TAT 6-24 HRS) Nasopharyngeal Nasopharyngeal Swab     Status: None   Collection Time: 02/24/20  9:10 AM   Specimen: Nasopharyngeal Swab  Result Value Ref Range   SARS Coronavirus 2 NEGATIVE NEGATIVE    Comment: (NOTE) SARS-CoV-2 target nucleic acids are NOT DETECTED.  The SARS-CoV-2 RNA is generally detectable in upper and lower respiratory specimens during the acute phase of infection. Negative results do not preclude SARS-CoV-2 infection, do not rule out co-infections with other pathogens, and should not be used as the sole basis for treatment or other patient management decisions. Negative results must be combined with clinical observations, patient history, and epidemiological information. The expected result is Negative.  Fact Sheet for Patients: 04/25/20  Fact Sheet for Healthcare Providers: HairSlick.no  This test is not yet approved or cleared by the quierodirigir.com FDA and   has been authorized for detection and/or diagnosis of SARS-CoV-2 by FDA under an Emergency Use Authorization (EUA). This EUA will remain  in effect (meaning this test can be used) for the duration of the COVID-19 declaration under Se ction 564(b)(1) of the Act, 21 U.S.C. section 360bbb-3(b)(1), unless the authorization is terminated or revoked sooner.  Performed at [facility], [address], [place], [place]     Radiology: No results found.  No results found.  No results found.    Assessment and Plan: Patient Active Problem List   Diagnosis Date Noted   Encounter for screening colonoscopy 02/04/2020   Hyperlipidemia LDL goal <100 04/23/2019   Moderate asthma without complication 12/15/2017   Essential hypertension 02/10/2016   Heartburn 02/10/2016   Moderate obesity 02/10/2016   Obstructive sleep apnea syndrome 02/10/2016    1. Essential hypertension Continue to monitor blood pressure.   2. Moderate persistent asthma without complication Stable, continue present management.  3. Obstructive sleep apnea syndrome Not currently on cpap due to weight loss  4. Gastroesophageal reflux disease without esophagitis Stable, continue present  management.   General Counseling: I have discussed the findings of the evaluation and examination with Greggory Stallion.  I have also discussed any further diagnostic evaluation thatmay be needed or ordered today. Diogenes verbalizes understanding of the findings of todays visit. We also reviewed his medications today and discussed drug interactions and side effects including but not limited excessive drowsiness and altered mental states. We also discussed that there is always a risk not just to him but also people around him. he has been encouraged to call the office with any questions or concerns that should arise related to todays visit.  No orders of the defined types were placed in this encounter.    Time spent: 25 This  patient was seen by Blima Ledger AGNP-C in Collaboration with Dr. Freda Munro as a part of collaborative care agreement.   I have personally obtained a history, examined the patient, evaluated laboratory and imaging results, formulated the assessment and plan and placed orders.    Yevonne Pax, MD [facility] Pulmonary and Critical Care Sleep medicine

## 2020-03-30 ENCOUNTER — Telehealth: Payer: Self-pay

## 2020-03-30 NOTE — Telephone Encounter (Signed)
Confirmed and screened for 04-01-20 ov. 

## 2020-04-01 ENCOUNTER — Other Ambulatory Visit: Payer: Self-pay

## 2020-04-01 ENCOUNTER — Encounter: Payer: Self-pay | Admitting: Hospice and Palliative Medicine

## 2020-04-01 ENCOUNTER — Ambulatory Visit: Payer: BC Managed Care – PPO | Admitting: Hospice and Palliative Medicine

## 2020-04-01 DIAGNOSIS — I10 Essential (primary) hypertension: Secondary | ICD-10-CM | POA: Diagnosis not present

## 2020-04-01 DIAGNOSIS — G4733 Obstructive sleep apnea (adult) (pediatric): Secondary | ICD-10-CM

## 2020-04-01 DIAGNOSIS — E668 Other obesity: Secondary | ICD-10-CM

## 2020-04-01 DIAGNOSIS — Z0001 Encounter for general adult medical examination with abnormal findings: Secondary | ICD-10-CM

## 2020-04-01 MED ORDER — PHENTERMINE HCL 37.5 MG PO CAPS
37.5000 mg | ORAL_CAPSULE | ORAL | 0 refills | Status: DC
Start: 2020-04-01 — End: 2020-05-05

## 2020-04-01 MED ORDER — PHENTERMINE HCL 37.5 MG PO TABS: 37.5000 mg | ORAL_TABLET | Freq: Every day | ORAL | 0 refills | Status: DC

## 2020-04-01 NOTE — Progress Notes (Signed)
[facility] [address] [place], [place] [address]  Internal MEDICINE  Office Visit Note  Patient Name: Luis Hendrix  [id number]  [id number]  Date of Service: 04/01/2020  Chief Complaint  Patient presents with  . Follow-up    weight management   . Hypertension  . Sleep Apnea  . Quality Metric Gaps    TDAP    HPI Patient is here for routine follow-up He is here for weight management today He continues to take his phentermine to assist with weight loss, he is also exercising daily and watching what he eats He has recently started back on phentermine, has been on this dose for about 2 months, he has been on it in the past but it has been over a year His ideal weight is 260 lbs Denies any side effects from medication, sleeping well, denies chest pain, palpitations or headaches  Current Medication: Outpatient Encounter Medications as of 04/01/2020  Medication Sig  . albuterol (PROAIR HFA) 108 (90 Base) MCG/ACT inhaler ProAir HFA 90 mcg/actuation aerosol inhaler  . budesonide-formoterol (SYMBICORT) 160-4.5 MCG/ACT inhaler Inhale 2 puffs into the lungs 2 (two) times daily.  Marland Kitchen lisinopril (ZESTRIL) 20 MG tablet Take 1 tablet (20 mg total) by mouth daily.  . phentermine (ADIPEX-P) 37.5 MG tablet Take 1 tablet (37.5 mg total) by mouth daily before breakfast.  . SYMBICORT 160-4.5 MCG/ACT inhaler Inhale 1 puff into the lungs 2 (two) times daily as needed.  . [DISCONTINUED] phentermine (ADIPEX-P) 37.5 MG tablet Take 1 tablet (37.5 mg total) by mouth daily before breakfast.  . phentermine 37.5 MG capsule Take 1 capsule (37.5 mg total) by mouth every morning. Do not fill before 05/01/2020   No facility-administered encounter medications on file as of 04/01/2020.    Surgical History: Past Surgical History:  Procedure Laterality Date  . COLONOSCOPY WITH PROPOFOL N/A 02/26/2020   Procedure: COLONOSCOPY WITH PROPOFOL;  Surgeon: Toney Reil, MD;  Location: [facility]  ENDOSCOPY;  Service: Gastroenterology;  Laterality: N/A;  . DIAGNOSTIC LAPAROSCOPY    . ELBOW FRACTURE SURGERY Right    Approx age 68  . FRACTURE SURGERY    . LAPAROSCOPIC GASTRIC SLEEVE RESECTION      Medical History: Past Medical History:  Diagnosis Date  . Asthma   . Hypertension   . Sleep apnea     Family History: Family History  Problem Relation Age of Onset  . Hypertension Father     Social History   Socioeconomic History  . Marital status: Married    Spouse name: Not on file  . Number of children: Not on file  . Years of education: Not on file  . Highest education level: Not on file  Occupational History  . Not on file  Tobacco Use  . Smoking status: Never Smoker  . Smokeless tobacco: Never Used  Vaping Use  . Vaping Use: Never used  Substance and Sexual Activity  . Alcohol use: Yes    Comment: OCCASIONALLY  . Drug use: No  . Sexual activity: Yes    Partners: Female  Other Topics Concern  . Not on file  Social History Narrative  . Not on file   Social Determinants of Health   Financial Resource Strain:   . Difficulty of Paying Living Expenses: Not on file  Food Insecurity:   . Worried About Programme researcher, broadcasting/film/video in the Last Year: Not on file  . Ran Out of Food in the Last Year: Not on file  Transportation  Needs:   . Lack of Transportation (Medical): Not on file  . Lack of Transportation (Non-Medical): Not on file  Physical Activity:   . Days of Exercise per Week: Not on file  . Minutes of Exercise per Session: Not on file  Stress:   . Feeling of Stress : Not on file  Social Connections:   . Frequency of Communication with Friends and Family: Not on file  . Frequency of Social Gatherings with Friends and Family: Not on file  . Attends Religious Services: Not on file  . Active Member of Clubs or Organizations: Not on file  . Attends Banker Meetings: Not on file  . Marital Status: Not on file  Intimate Partner Violence:   . Fear of  Current or Ex-Partner: Not on file  . Emotionally Abused: Not on file  . Physically Abused: Not on file  . Sexually Abused: Not on file   Review of Systems  Constitutional: Negative for chills, fatigue and unexpected weight change.  HENT: Negative for congestion, postnasal drip, rhinorrhea, sneezing and sore throat.   Eyes: Negative for redness.  Respiratory: Negative for cough, chest tightness and shortness of breath.   Cardiovascular: Negative for chest pain and palpitations.  Gastrointestinal: Negative for abdominal pain, constipation, diarrhea, nausea and vomiting.  Genitourinary: Negative for dysuria and frequency.  Musculoskeletal: Negative for arthralgias, back pain, joint swelling and neck pain.  Skin: Negative for rash.  Neurological: Negative for tremors and numbness.  Hematological: Negative for adenopathy. Does not bruise/bleed easily.  Psychiatric/Behavioral: Negative for behavioral problems (Depression), sleep disturbance and suicidal ideas. The patient is not nervous/anxious.     Vital Signs: BP 127/90   Pulse 74   Temp 98.1 F (36.7 C)   Resp 16   Ht 6\' 1"  (1.854 m)   Wt 272 lb (123.4 kg)   SpO2 99%   BMI 35.89 kg/m    Physical Exam Vitals reviewed.  Constitutional:      Appearance: Normal appearance.  HENT:     Mouth/Throat:     Mouth: Mucous membranes are moist.     Pharynx: Oropharynx is clear.  Cardiovascular:     Rate and Rhythm: Normal rate and regular rhythm.     Pulses: Normal pulses.     Heart sounds: Normal heart sounds.  Pulmonary:     Effort: Pulmonary effort is normal.     Breath sounds: Normal breath sounds.  Abdominal:     General: Abdomen is flat.     Palpations: Abdomen is soft.  Musculoskeletal:        General: Normal range of motion.     Cervical back: Normal range of motion.  Skin:    General: Skin is warm.  Neurological:     General: No focal deficit present.     Mental Status: He is alert and oriented to person, place,  and time. Mental status is at baseline.  Psychiatric:        Mood and Affect: Mood normal.        Behavior: Behavior normal.        Thought Content: Thought content normal.    Assessment/Plan: 1. Moderate obesity Continue with lifestyle habits that will assist with weight loss, healthy eating habits as well as daily exercise. Will continue with phentermine at this time as he is having stable weight loss at this time. Will continue to closely monitor response to therapy, aware that therapy should be short-term, discontinue therapy around 9 months. - phentermine (ADIPEX-P)  37.5 MG tablet; Take 1 tablet (37.5 mg total) by mouth daily before breakfast.  Dispense: 30 tablet; Refill: 0  2. Essential hypertension BP and HR stable at this time, continue with current therapy and routine monitoring.  3. OSA (obstructive sleep apnea) Continues to report no issues related to OSA and not currently wearing his CPAP. Discussed the risks associated with untreated OSA. He reports not feeling fatigued during the day, wakes up feeling refreshed and denies headaches upon awakening. His wife has reported that he no longer snores or having apneic periods.  He is not interested in having repeat testing at this time.  Labs ordered for upcoming CPE in October, up to date on colonoscopy - CBC w/Diff/Platelet - Comprehensive Metabolic Panel (CMET) - Lipid Panel With LDL/HDL Ratio - TSH + free T4 - PSA  General Counseling: billie intriago understanding of the findings of todays visit and agrees with plan of treatment. I have discussed any further diagnostic evaluation that may be needed or ordered today. We also reviewed his medications today. he has been encouraged to call the office with any questions or concerns that should arise related to todays visit.    Orders Placed This Encounter  Procedures  . CBC w/Diff/Platelet  . Comprehensive Metabolic Panel (CMET)  . Lipid Panel With LDL/HDL Ratio  . TSH +  free T4  . PSA    Meds ordered this encounter  Medications  . phentermine (ADIPEX-P) 37.5 MG tablet    Sig: Take 1 tablet (37.5 mg total) by mouth daily before breakfast.    Dispense:  30 tablet    Refill:  0  . phentermine 37.5 MG capsule    Sig: Take 1 capsule (37.5 mg total) by mouth every morning. Do not fill before 05/01/2020    Dispense:  30 capsule    Refill:  0    Time spent: 30 Minutes Time spent includes review of chart, medications, test results and follow-up plan with the patient.  This patient was seen by Leeanne Deed AGNP-C in Collaboration with Dr Lyndon Code as a part of collaborative care agreement     Lubertha Basque. Nahzir Pohle AGNP-C Internal medicine

## 2020-04-08 ENCOUNTER — Telehealth: Payer: Self-pay

## 2020-04-08 NOTE — Telephone Encounter (Signed)
Called [facility] pharmacy and canceled the phentermine capsules prescription per DFK.  Spoke to [employer]

## 2020-04-29 LAB — COMPREHENSIVE METABOLIC PANEL
ALT: 22 IU/L (ref 0–44)
AST: 26 IU/L (ref 0–40)
Albumin/Globulin Ratio: 2 (ref 1.2–2.2)
Albumin: 4.7 g/dL (ref 3.8–4.9)
Alkaline Phosphatase: 76 IU/L (ref 44–121)
BUN/Creatinine Ratio: 12 (ref 9–20)
BUN: 13 mg/dL (ref 6–24)
Bilirubin Total: 0.5 mg/dL (ref 0.0–1.2)
CO2: 25 mmol/L (ref 20–29)
Calcium: 9.9 mg/dL (ref 8.7–10.2)
Chloride: 100 mmol/L (ref 96–106)
Creatinine, Ser: 1.08 mg/dL (ref 0.76–1.27)
GFR calc Af Amer: 91 mL/min/{1.73_m2} (ref >59)
GFR calc non Af Amer: 79 mL/min/{1.73_m2} (ref >59)
Globulin, Total: 2.3 g/dL (ref 1.5–4.5)
Glucose: 56 mg/dL — ABNORMAL LOW (ref 65–99)
Potassium: 4.5 mmol/L (ref 3.5–5.2)
Sodium: 138 mmol/L (ref 134–144)
Total Protein: 7 g/dL (ref 6.0–8.5)

## 2020-04-29 LAB — CBC WITH DIFFERENTIAL/PLATELET
Basophils Absolute: 0 10*3/uL (ref 0.0–0.2)
Basos: 1 %
EOS (ABSOLUTE): 0.1 10*3/uL (ref 0.0–0.4)
Eos: 2 %
Hematocrit: 44 % (ref 37.5–51.0)
Hemoglobin: 14.2 g/dL (ref 13.0–17.7)
Immature Grans (Abs): 0 10*3/uL (ref 0.0–0.1)
Immature Granulocytes: 0 %
Lymphocytes Absolute: 1.6 10*3/uL (ref 0.7–3.1)
Lymphs: 31 %
MCH: 27.3 pg (ref 26.6–33.0)
MCHC: 32.3 g/dL (ref 31.5–35.7)
MCV: 85 fL (ref 79–97)
Monocytes Absolute: 0.4 10*3/uL (ref 0.1–0.9)
Monocytes: 8 %
Neutrophils Absolute: 2.9 10*3/uL (ref 1.4–7.0)
Neutrophils: 58 %
Platelets: 248 10*3/uL (ref 150–450)
RBC: 5.2 x10E6/uL (ref 4.14–5.80)
RDW: 13 % (ref 11.6–15.4)
WBC: 5 10*3/uL (ref 3.4–10.8)

## 2020-04-29 LAB — LIPID PANEL WITH LDL/HDL RATIO
Cholesterol, Total: 179 mg/dL (ref 100–199)
HDL: 63 mg/dL (ref >39)
LDL Chol Calc (NIH): 104 mg/dL — ABNORMAL HIGH (ref 0–99)
LDL/HDL Ratio: 1.7 ratio (ref 0.0–3.6)
Triglycerides: 63 mg/dL (ref 0–149)
VLDL Cholesterol Cal: 12 mg/dL (ref 5–40)

## 2020-04-29 LAB — TSH+FREE T4
Free T4: 1.16 ng/dL (ref 0.82–1.77)
TSH: 2.29 u[IU]/mL (ref 0.450–4.500)

## 2020-04-29 LAB — PSA: Prostate Specific Ag, Serum: 0.9 ng/mL (ref 0.0–4.0)

## 2020-05-05 ENCOUNTER — Other Ambulatory Visit: Payer: Self-pay

## 2020-05-05 ENCOUNTER — Ambulatory Visit (INDEPENDENT_AMBULATORY_CARE_PROVIDER_SITE_OTHER): Payer: BC Managed Care – PPO | Admitting: Nurse Practitioner

## 2020-05-05 ENCOUNTER — Encounter: Payer: Self-pay | Admitting: Nurse Practitioner

## 2020-05-05 VITALS — BP 139/83 | HR 77 | Temp 97.6°F | Resp 16 | Ht 73.0 in | Wt 270.2 lb

## 2020-05-05 DIAGNOSIS — G4733 Obstructive sleep apnea (adult) (pediatric): Secondary | ICD-10-CM

## 2020-05-05 DIAGNOSIS — R300 Dysuria: Secondary | ICD-10-CM

## 2020-05-05 DIAGNOSIS — I10 Essential (primary) hypertension: Secondary | ICD-10-CM

## 2020-05-05 DIAGNOSIS — J4540 Moderate persistent asthma, uncomplicated: Secondary | ICD-10-CM | POA: Diagnosis not present

## 2020-05-05 DIAGNOSIS — Z0001 Encounter for general adult medical examination with abnormal findings: Secondary | ICD-10-CM

## 2020-05-05 DIAGNOSIS — E668 Other obesity: Secondary | ICD-10-CM

## 2020-05-05 MED ORDER — PHENTERMINE HCL 37.5 MG PO TABS: 37.5000 mg | ORAL_TABLET | Freq: Every day | ORAL | 0 refills | Status: DC

## 2020-05-05 NOTE — Progress Notes (Signed)
[facility] [place], [place] [address]  Internal MEDICINE  Office Visit Note  Patient Name: Luis Hendrix  [id number]  [id number]  Date of Service: 05/24/2020   Pt is here for routine health maintenance examination  Chief Complaint  Patient presents with   Annual Exam   Hypertension   Quality Metric Gaps    flu,tetnaus   controlled substance form    reviewed with PT     The patient is here for health maintenance exam. He had his routine, fasting labs done prior to this visit. His LDL cholesterol is 104 with remainder of lipid panel normal. Due to fasting, his glucose was 56. All other labs were normal. His blood pressure is well managed and his asthma is also controlled. States that he rarely needs to use rescue inhaler.  He would like to restart his phentermine. He has had success with this in the past without negative side effects. He was restarted on this in 01/2020 and took for two months. When he was seen last month, prescription was renewed, however, the patient states that it was never received by the pharmacy. This is verified per his PDMP fill record. In past month, he has lost two pounds. He has entered into boot camp for weight loss and has made some major changes in his diet. He understands that on phentermine, he will be seen month to month and will stay on the medication for no longer than 9 months at a time. Will restart this today.     Current Medication: Outpatient Encounter Medications as of 05/05/2020  Medication Sig   albuterol (PROAIR HFA) 108 (90 Base) MCG/ACT inhaler ProAir HFA 90 mcg/actuation aerosol inhaler   budesonide-formoterol (SYMBICORT) 160-4.5 MCG/ACT inhaler Inhale 2 puffs into the lungs 2 (two) times daily.   lisinopril (ZESTRIL) 20 MG tablet Take 1 tablet (20 mg total) by mouth daily.   phentermine (ADIPEX-P) 37.5 MG tablet Take 1 tablet (37.5 mg total) by mouth daily before breakfast.   SYMBICORT  160-4.5 MCG/ACT inhaler Inhale 1 puff into the lungs 2 (two) times daily as needed.   [DISCONTINUED] phentermine (ADIPEX-P) 37.5 MG tablet Take 1 tablet (37.5 mg total) by mouth daily before breakfast.   [DISCONTINUED] phentermine 37.5 MG capsule Take 1 capsule (37.5 mg total) by mouth every morning. Do not fill before 05/01/2020   No facility-administered encounter medications on file as of 05/05/2020.    Surgical History: Past Surgical History:  Procedure Laterality Date   COLONOSCOPY WITH PROPOFOL N/A 02/26/2020   Procedure: COLONOSCOPY WITH PROPOFOL;  Surgeon: Lin Landsman, MD;  Location: [facility] ENDOSCOPY;  Service: Gastroenterology;  Laterality: N/A;   DIAGNOSTIC LAPAROSCOPY     ELBOW FRACTURE SURGERY Right    Approx age 55   FRACTURE SURGERY     LAPAROSCOPIC GASTRIC SLEEVE RESECTION      Medical History: Past Medical History:  Diagnosis Date   Asthma    Hypertension    Sleep apnea     Family History: Family History  Problem Relation Age of Onset   Hypertension Father       Review of Systems  Constitutional: Negative for chills, fatigue and unexpected weight change.       Two pound weight loss since his last visit .  HENT: Negative for congestion, postnasal drip, rhinorrhea, sneezing and sore throat.   Respiratory: Negative for cough, chest tightness and shortness of breath.   Cardiovascular: Negative for chest pain and palpitations.  Gastrointestinal: Negative  for abdominal pain, constipation, diarrhea, nausea and vomiting.  Endocrine: Negative for cold intolerance, heat intolerance, polydipsia and polyuria.  Genitourinary: Negative for dysuria, frequency, hematuria and urgency.  Musculoskeletal: Negative for arthralgias, back pain, joint swelling and neck pain.  Skin: Negative for rash.  Allergic/Immunologic: Positive for environmental allergies.  Neurological: Negative for dizziness, tremors, numbness and headaches.  Hematological: Negative for  adenopathy. Does not bruise/bleed easily.  Psychiatric/Behavioral: Negative for behavioral problems (Depression), sleep disturbance and suicidal ideas. The patient is not nervous/anxious.      Today's Vitals   05/05/20 0834  BP: 139/83  Pulse: 77  Resp: 16  Temp: 97.6 F (36.4 C)  SpO2: 99%  Weight: 270 lb 3.2 oz (122.6 kg)  Height: '6\' 1"'  (1.854 m)   Body mass index is 35.65 kg/m.  Physical Exam Vitals and nursing note reviewed.  Constitutional:      General: He is not in acute distress.    Appearance: Normal appearance. He is well-developed. He is obese. He is not diaphoretic.  HENT:     Head: Normocephalic and atraumatic.     Nose: Nose normal.     Mouth/Throat:     Pharynx: No oropharyngeal exudate.  Eyes:     Pupils: Pupils are equal, round, and reactive to light.  Neck:     Thyroid: No thyromegaly.     Vascular: No carotid bruit or JVD.     Trachea: No tracheal deviation.  Cardiovascular:     Rate and Rhythm: Normal rate and regular rhythm.     Pulses: Normal pulses.     Heart sounds: Normal heart sounds. No murmur heard.  No friction rub. No gallop.   Pulmonary:     Effort: Pulmonary effort is normal. No respiratory distress.     Breath sounds: Normal breath sounds. No wheezing or rales.  Chest:     Chest wall: No tenderness.  Abdominal:     General: Bowel sounds are normal.     Palpations: Abdomen is soft.     Tenderness: There is no abdominal tenderness.  Musculoskeletal:        General: Normal range of motion.     Cervical back: Normal range of motion and neck supple.  Lymphadenopathy:     Cervical: No cervical adenopathy.  Skin:    General: Skin is warm and dry.     Capillary Refill: Capillary refill takes less than 2 seconds.  Neurological:     General: No focal deficit present.     Mental Status: He is alert and oriented to person, place, and time.     Cranial Nerves: No cranial nerve deficit.  Psychiatric:        Mood and Affect: Mood normal.         Behavior: Behavior normal.        Thought Content: Thought content normal.        Judgment: Judgment normal.      LABS: Recent Results (from the past 2160 hour(s))  CBC w/Diff/Platelet     Status: None   Collection Time: 04/28/20  9:33 AM  Result Value Ref Range   WBC 5.0 3.4 - 10.8 x10E3/uL   RBC 5.20 4.14 - 5.80 x10E6/uL   Hemoglobin 14.2 13.0 - 17.7 g/dL   Hematocrit 44.0 37.5 - 51.0 %   MCV 85 79 - 97 fL   MCH 27.3 26.6 - 33.0 pg   MCHC 32.3 31 - 35 g/dL   RDW 13.0 11.6 - 15.4 %  Platelets 248 150 - 450 x10E3/uL   Neutrophils 58 Not Estab. %   Lymphs 31 Not Estab. %   Monocytes 8 Not Estab. %   Eos 2 Not Estab. %   Basos 1 Not Estab. %   Neutrophils Absolute 2.9 1.40 - 7.00 x10E3/uL   Lymphocytes Absolute 1.6 0 - 3 x10E3/uL   Monocytes Absolute 0.4 0 - 0 x10E3/uL   EOS (ABSOLUTE) 0.1 0.0 - 0.4 x10E3/uL   Basophils Absolute 0.0 0 - 0 x10E3/uL   Immature Granulocytes 0 Not Estab. %   Immature Grans (Abs) 0.0 0.0 - 0.1 x10E3/uL  Comprehensive Metabolic Panel (CMET)     Status: Abnormal   Collection Time: 04/28/20  9:33 AM  Result Value Ref Range   Glucose 56 (L) 65 - 99 mg/dL   BUN 13 6 - 24 mg/dL   Creatinine, Ser 1.08 0.76 - 1.27 mg/dL   GFR calc non Af Amer 79 >59 mL/min/1.73   GFR calc Af Amer 91 >59 mL/min/1.73    Comment: **[facility] currently reports eGFR in compliance with the current**   recommendations of the [employer]. [facility] will   update reporting as new guidelines are published from the NKF-ASN   Task force.    BUN/Creatinine Ratio 12 9 - 20   Sodium 138 134 - 144 mmol/L   Potassium 4.5 3.5 - 5.2 mmol/L   Chloride 100 96 - 106 mmol/L   CO2 25 20 - 29 mmol/L   Calcium 9.9 8.7 - 10.2 mg/dL   Total Protein 7.0 6.0 - 8.5 g/dL   Albumin 4.7 3.8 - 4.9 g/dL   Globulin, Total 2.3 1.5 - 4.5 g/dL   Albumin/Globulin Ratio 2.0 1.2 - 2.2   Bilirubin Total 0.5 0.0 - 1.2 mg/dL   Alkaline Phosphatase 76 44 - 121 IU/L    Comment:                **Please note reference interval change**   AST 26 0 - 40 IU/L   ALT 22 0 - 44 IU/L  Lipid Panel With LDL/HDL Ratio     Status: Abnormal   Collection Time: 04/28/20  9:33 AM  Result Value Ref Range   Cholesterol, Total 179 100 - 199 mg/dL   Triglycerides 63 0 - 149 mg/dL   HDL 63 >39 mg/dL   VLDL Cholesterol Cal 12 5 - 40 mg/dL   LDL Chol Calc (NIH) 104 (H) 0 - 99 mg/dL   LDL/HDL Ratio 1.7 0.0 - 3.6 ratio    Comment:                                     LDL/HDL Ratio                                             Men  Women                               1/2 Avg.Risk  1.0    1.5                                   Avg.Risk  3.6    3.2  2X Avg.Risk  6.2    5.0                                3X Avg.Risk  8.0    6.1   TSH + free T4     Status: None   Collection Time: 04/28/20  9:33 AM  Result Value Ref Range   TSH 2.290 0.450 - 4.500 uIU/mL   Free T4 1.16 0.82 - 1.77 ng/dL  PSA     Status: None   Collection Time: 04/28/20  9:33 AM  Result Value Ref Range   Prostate Specific Ag, Serum 0.9 0.0 - 4.0 ng/mL    Comment: Roche ECLIA methodology. According to the American Urological Association, Serum PSA should decrease and remain at undetectable levels after radical prostatectomy. The AUA defines biochemical recurrence as an initial PSA value 0.2 ng/mL or greater followed by a subsequent confirmatory PSA value 0.2 ng/mL or greater. Values obtained with different assay methods or kits cannot be used interchangeably. Results cannot be interpreted as absolute evidence of the presence or absence of malignant disease.   UA/M w/rflx Culture, Routine     Status: None   Collection Time: 05/05/20 10:34 AM   Specimen: Urine   Urine  Result Value Ref Range   Specific Gravity, UA 1.012 1.005 - 1.030   pH, UA 6.5 5.0 - 7.5   Color, UA Yellow Yellow   Appearance Ur Clear Clear   Leukocytes,UA Negative Negative   Protein,UA Negative Negative/Trace   Glucose, UA  Negative Negative   Ketones, UA Negative Negative   RBC, UA Negative Negative   Bilirubin, UA Negative Negative   Urobilinogen, Ur 1.0 0.2 - 1.0 mg/dL   Nitrite, UA Negative Negative   Microscopic Examination Comment     Comment: Microscopic follows if indicated.   Microscopic Examination See below:     Comment: Microscopic was indicated and was performed.   Urinalysis Reflex Comment     Comment: This specimen will not reflex to a Urine Culture.  Microscopic Examination     Status: None   Collection Time: 05/05/20 10:34 AM   Urine  Result Value Ref Range   WBC, UA None seen 0 - 5 /hpf   RBC None seen 0 - 2 /hpf   Epithelial Cells (non renal) None seen 0 - 10 /hpf   Casts None seen None seen /lpf   Bacteria, UA None seen None seen/Few    Assessment/Plan: 1. Encounter for routine adult health examination with abnormal findings Annual health maintenance exam today.  2. Essential hypertension Stable. Continue bp medication as prescribed   3. Moderate persistent asthma without complication Well managed. Continue symbicort twice daily and use of rescue inhaler as needed and as prescribed.   4. Obstructive sleep apnea syndrome Continue regular visits with Dr. Devona Konig for CPAP management.   5. Moderate obesity Restart phentermine 37.43m tablets daily. Patient to limit calorie intake to 1500 to 2000 calories per day. Continue to incorporate exercise into daily routine.  - phentermine (ADIPEX-P) 37.5 MG tablet; Take 1 tablet (37.5 mg total) by mouth daily before breakfast.  Dispense: 30 tablet; Refill: 0  6. Dysuria - UA/M w/rflx Culture, Routine  General Counseling: Gmckinley olheiserunderstanding of the findings of todays visit and agrees with plan of treatment. I have discussed any further diagnostic evaluation that may be needed or ordered today. We also reviewed his medications today. he has  been encouraged to call the office with any questions or concerns that should arise  related to todays visit.    Counseling:  Obesity Counseling: Risk Assessment: An assessment of behavioral risk factors was made today and includes lack of exercise sedentary lifestyle, lack of portion control and poor dietary habits.  Risk Modification Advice: She was counseled on portion control guidelines. Restricting daily caloric intake to 1500 to 2000 calories per day. The detrimental long term effects of obesity on her health and ongoing poor compliance was also discussed with the patient.  This patient was seen by Murphy with Dr Lavera Guise as a part of collaborative care agreement  Orders Placed This Encounter  Procedures   Microscopic Examination   UA/M w/rflx Culture, Routine    Meds ordered this encounter  Medications   phentermine (ADIPEX-P) 37.5 MG tablet    Sig: Take 1 tablet (37.5 mg total) by mouth daily before breakfast.    Dispense:  30 tablet    Refill:  0    Order Specific Question:   Supervising Provider    Answer:   Lavera Guise [[id number]]    Total time spent: 47 Minutes  Time spent includes review of chart, medications, test results, and follow up plan with the patient.     Lavera Guise, MD  Internal Medicine

## 2020-05-06 LAB — UA/M W/RFLX CULTURE, ROUTINE
Bilirubin, UA: NEGATIVE
Glucose, UA: NEGATIVE
Ketones, UA: NEGATIVE
Leukocytes,UA: NEGATIVE
Nitrite, UA: NEGATIVE
Protein,UA: NEGATIVE
RBC, UA: NEGATIVE
Specific Gravity, UA: 1.012 (ref 1.005–1.030)
Urobilinogen, Ur: 1 mg/dL (ref 0.2–1.0)
pH, UA: 6.5 (ref 5.0–7.5)

## 2020-05-06 LAB — MICROSCOPIC EXAMINATION
Bacteria, UA: NONE SEEN
Casts: NONE SEEN /lpf
Epithelial Cells (non renal): NONE SEEN /hpf (ref 0–10)
RBC, Urine: NONE SEEN /hpf (ref 0–2)
WBC, UA: NONE SEEN /hpf (ref 0–5)

## 2020-05-24 DIAGNOSIS — Z0001 Encounter for general adult medical examination with abnormal findings: Secondary | ICD-10-CM | POA: Insufficient documentation

## 2020-05-24 DIAGNOSIS — R300 Dysuria: Secondary | ICD-10-CM | POA: Insufficient documentation

## 2020-06-02 ENCOUNTER — Encounter: Payer: Self-pay | Admitting: Nurse Practitioner

## 2020-06-02 ENCOUNTER — Other Ambulatory Visit: Payer: Self-pay

## 2020-06-02 ENCOUNTER — Ambulatory Visit: Payer: BC Managed Care – PPO | Admitting: Nurse Practitioner

## 2020-06-02 VITALS — BP 128/82 | HR 83 | Temp 97.8°F | Resp 16 | Ht 73.0 in | Wt 269.8 lb

## 2020-06-02 DIAGNOSIS — I10 Essential (primary) hypertension: Secondary | ICD-10-CM

## 2020-06-02 DIAGNOSIS — Z6835 Body mass index (BMI) 35.0-35.9, adult: Secondary | ICD-10-CM | POA: Diagnosis not present

## 2020-06-02 DIAGNOSIS — J4540 Moderate persistent asthma, uncomplicated: Secondary | ICD-10-CM | POA: Diagnosis not present

## 2020-06-02 DIAGNOSIS — G4733 Obstructive sleep apnea (adult) (pediatric): Secondary | ICD-10-CM

## 2020-06-02 DIAGNOSIS — E668 Other obesity: Secondary | ICD-10-CM

## 2020-06-02 MED ORDER — PHENTERMINE HCL 37.5 MG PO TABS: 37.5000 mg | ORAL_TABLET | Freq: Every day | ORAL | 0 refills | Status: DC

## 2020-06-02 NOTE — Progress Notes (Signed)
[facility] [address] [place], [place] [address]  Internal MEDICINE  Office Visit Note  Patient Name: Luis Hendrix  [id number]  [id number]  Date of Service: 06/28/2020  Chief Complaint  Patient presents with  . Follow-up  . Hypertension  . controlled substance form    reviewed with PT    The patient is here for follow up of weight management.  -has been on phentermine for past three months. Lost three pounds since restarting this medication.  -exercising four to five times per week. Limiting calorie intake to 1500 calories per day. No negative side effects associated with taking this medication. -blood pressure well controlled -asthma is managed well.       Current Medication: Outpatient Encounter Medications as of 06/02/2020  Medication Sig  . albuterol (PROAIR HFA) 108 (90 Base) MCG/ACT inhaler ProAir HFA 90 mcg/actuation aerosol inhaler  . budesonide-formoterol (SYMBICORT) 160-4.5 MCG/ACT inhaler Inhale 2 puffs into the lungs 2 (two) times daily.  Marland Kitchen lisinopril (ZESTRIL) 20 MG tablet Take 1 tablet (20 mg total) by mouth daily.  . phentermine (ADIPEX-P) 37.5 MG tablet Take 1 tablet (37.5 mg total) by mouth daily before breakfast.  . SYMBICORT 160-4.5 MCG/ACT inhaler Inhale 1 puff into the lungs 2 (two) times daily as needed.  . [DISCONTINUED] phentermine (ADIPEX-P) 37.5 MG tablet Take 1 tablet (37.5 mg total) by mouth daily before breakfast.   No facility-administered encounter medications on file as of 06/02/2020.    Surgical History: Past Surgical History:  Procedure Laterality Date  . COLONOSCOPY WITH PROPOFOL N/A 02/26/2020   Procedure: COLONOSCOPY WITH PROPOFOL;  Surgeon: Toney Reil, MD;  Location: [facility] ENDOSCOPY;  Service: Gastroenterology;  Laterality: N/A;  . DIAGNOSTIC LAPAROSCOPY    . ELBOW FRACTURE SURGERY Right    Approx age 15  . FRACTURE SURGERY    . LAPAROSCOPIC GASTRIC SLEEVE RESECTION      Medical History: Past  Medical History:  Diagnosis Date  . Asthma   . Hypertension   . Sleep apnea     Family History: Family History  Problem Relation Age of Onset  . Hypertension Father     Social History   Socioeconomic History  . Marital status: Married    Spouse name: Not on file  . Number of children: Not on file  . Years of education: Not on file  . Highest education level: Not on file  Occupational History  . Not on file  Tobacco Use  . Smoking status: Never Smoker  . Smokeless tobacco: Never Used  Vaping Use  . Vaping Use: Never used  Substance and Sexual Activity  . Alcohol use: Yes    Comment: OCCASIONALLY  . Drug use: No  . Sexual activity: Yes    Partners: Female  Other Topics Concern  . Not on file  Social History Narrative  . Not on file   Social Determinants of Health   Financial Resource Strain: Not on file  Food Insecurity: Not on file  Transportation Needs: Not on file  Physical Activity: Not on file  Stress: Not on file  Social Connections: Not on file  Intimate Partner Violence: Not on file      Review of Systems  Constitutional: Negative for chills, fatigue and unexpected weight change.       One pound weight loss since his last visit .  HENT: Negative for congestion, postnasal drip, rhinorrhea, sneezing and sore throat.   Respiratory: Negative for cough, chest tightness and shortness of breath.  Cardiovascular: Negative for chest pain and palpitations.  Gastrointestinal: Negative for abdominal pain, constipation, diarrhea, nausea and vomiting.  Endocrine: Negative for cold intolerance, heat intolerance, polydipsia and polyuria.  Musculoskeletal: Negative for arthralgias, back pain, joint swelling and neck pain.  Skin: Negative for rash.  Allergic/Immunologic: Positive for environmental allergies.  Neurological: Negative for dizziness, tremors, numbness and headaches.  Hematological: Negative for adenopathy. Does not bruise/bleed easily.   Psychiatric/Behavioral: Negative for behavioral problems (Depression), sleep disturbance and suicidal ideas. The patient is not nervous/anxious.     Today's Vitals   06/02/20 1100  BP: 128/82  Pulse: 83  Resp: 16  Temp: 97.8 F (36.6 C)  SpO2: 97%  Weight: 269 lb 12.8 oz (122.4 kg)  Height: 6\' 1"  (1.854 m)   Body mass index is 35.6 kg/m.  Physical Exam Vitals and nursing note reviewed.  Constitutional:      General: He is not in acute distress.    Appearance: Normal appearance. He is well-developed and well-nourished. He is obese. He is not diaphoretic.  HENT:     Head: Normocephalic and atraumatic.     Nose: Nose normal.     Mouth/Throat:     Mouth: Oropharynx is clear and moist.     Pharynx: No oropharyngeal exudate.  Eyes:     Extraocular Movements: EOM normal.     Pupils: Pupils are equal, round, and reactive to light.  Neck:     Thyroid: No thyromegaly.     Vascular: No JVD.     Trachea: No tracheal deviation.  Cardiovascular:     Rate and Rhythm: Normal rate and regular rhythm.     Heart sounds: Normal heart sounds. No murmur heard. No friction rub. No gallop.   Pulmonary:     Effort: Pulmonary effort is normal. No respiratory distress.     Breath sounds: Normal breath sounds. No wheezing or rales.  Chest:     Chest wall: No tenderness.  Abdominal:     Palpations: Abdomen is soft.  Musculoskeletal:        General: Normal range of motion.     Cervical back: Normal range of motion and neck supple.  Lymphadenopathy:     Cervical: No cervical adenopathy.  Skin:    General: Skin is warm and dry.  Neurological:     General: No focal deficit present.     Mental Status: He is alert and oriented to person, place, and time.     Cranial Nerves: No cranial nerve deficit.  Psychiatric:        Mood and Affect: Mood and affect and mood normal.        Behavior: Behavior normal.        Thought Content: Thought content normal.        Judgment: Judgment normal.    Assessment/Plan: 1. Moderate persistent asthma without complication Stable. Continue inhalers and respiratory medication as prescribed   2. Essential hypertension Stable. Continue bp medication as prescribed   3. BMI 35.0-35.9,adult May continue phentermine 37.5mg  tablets daily. Continue with 1500 calorie diet and incorporating exercise into daily routine.  - phentermine (ADIPEX-P) 37.5 MG tablet; Take 1 tablet (37.5 mg total) by mouth daily before breakfast.  Dispense: 30 tablet; Refill: 0  4. Obstructive sleep apnea syndrome Continue regular visits with Dr. 01-02-1979 for CPAP management.   General Counseling: jd mccaster understanding of the findings of todays visit and agrees with plan of treatment. I have discussed any further diagnostic evaluation that may be needed  or ordered today. We also reviewed his medications today. he has been encouraged to call the office with any questions or concerns that should arise related to todays visit.   There is a liability release in patients' chart. There has been a 10 minute discussion about the side effects including but not limited to elevated blood pressure, anxiety, lack of sleep and dry mouth. Pt understands and will like to start/continue on appetite suppressant at this time. There will be one month RX given at the time of visit with proper follow up. Nova diet plan with restricted calories is given to the pt. Pt understands and agrees with  plan of treatment  This patient was seen by Vincent Gros FNP Collaboration with Dr Lyndon Code as a part of collaborative care agreement  Meds ordered this encounter  Medications  . phentermine (ADIPEX-P) 37.5 MG tablet    Sig: Take 1 tablet (37.5 mg total) by mouth daily before breakfast.    Dispense:  30 tablet    Refill:  0    Please run with GoodRx card provided to patient at office visit    Order Specific Question:   Supervising Provider    Answer:   Lyndon Code [[id number]]     Total time spent: 25 Minutes   Time spent includes review of chart, medications, test results, and follow up plan with the patient.      Dr Lyndon Code Internal medicine

## 2020-06-29 ENCOUNTER — Ambulatory Visit: Payer: BC Managed Care – PPO | Admitting: Nurse Practitioner

## 2020-06-29 ENCOUNTER — Other Ambulatory Visit: Payer: Self-pay

## 2020-06-29 ENCOUNTER — Encounter: Payer: Self-pay | Admitting: Nurse Practitioner

## 2020-06-29 VITALS — BP 128/94 | HR 80 | Temp 97.3°F | Resp 16 | Ht 73.0 in | Wt 271.6 lb

## 2020-06-29 DIAGNOSIS — J4540 Moderate persistent asthma, uncomplicated: Secondary | ICD-10-CM

## 2020-06-29 DIAGNOSIS — Z6836 Body mass index (BMI) 36.0-36.9, adult: Secondary | ICD-10-CM

## 2020-06-29 DIAGNOSIS — I10 Essential (primary) hypertension: Secondary | ICD-10-CM

## 2020-06-29 DIAGNOSIS — G4733 Obstructive sleep apnea (adult) (pediatric): Secondary | ICD-10-CM | POA: Diagnosis not present

## 2020-06-29 NOTE — Progress Notes (Signed)
[facility] [address] [place], [place] [address]  Internal MEDICINE  Office Visit Note  Patient Name: Luis Hendrix  [id number]  [id number]  Date of Service: 07/26/2020  Chief Complaint  Patient presents with  . Follow-up  . Hypertension  . Sleep Apnea  . Asthma    The patient is here for follow up of weight management. He has been on phentermined for past few months, making little progress regarding weight. -Will do trial of sxaenda. Instructions for use and titration were reviewed with the patient. A sample pack was provided to the patient.  -Blood pressure good. -Asthma well controlled.      Current Medication: Outpatient Encounter Medications as of 06/29/2020  Medication Sig Note  . albuterol (PROAIR HFA) 108 (90 Base) MCG/ACT inhaler ProAir HFA 90 mcg/actuation aerosol inhaler   . budesonide-formoterol (SYMBICORT) 160-4.5 MCG/ACT inhaler Inhale 2 puffs into the lungs 2 (two) times daily.   Marland Kitchen lisinopril (ZESTRIL) 20 MG tablet Take 1 tablet (20 mg total) by mouth daily.   . SYMBICORT 160-4.5 MCG/ACT inhaler Inhale 1 puff into the lungs 2 (two) times daily as needed.   . [DISCONTINUED] phentermine (ADIPEX-P) 37.5 MG tablet Take 1 tablet (37.5 mg total) by mouth daily before breakfast. 06/29/2020: trial of saxenda   No facility-administered encounter medications on file as of 06/29/2020.    Surgical History: Past Surgical History:  Procedure Laterality Date  . COLONOSCOPY WITH PROPOFOL N/A 02/26/2020   Procedure: COLONOSCOPY WITH PROPOFOL;  Surgeon: Toney Reil, MD;  Location: [facility] ENDOSCOPY;  Service: Gastroenterology;  Laterality: N/A;  . DIAGNOSTIC LAPAROSCOPY    . ELBOW FRACTURE SURGERY Right    Approx age 75  . FRACTURE SURGERY    . LAPAROSCOPIC GASTRIC SLEEVE RESECTION      Medical History: Past Medical History:  Diagnosis Date  . Asthma   . Hypertension   . Sleep apnea     Family History: Family History  Problem Relation  Age of Onset  . Hypertension Father     Social History   Socioeconomic History  . Marital status: Married    Spouse name: Not on file  . Number of children: Not on file  . Years of education: Not on file  . Highest education level: Not on file  Occupational History  . Not on file  Tobacco Use  . Smoking status: Never Smoker  . Smokeless tobacco: Never Used  Vaping Use  . Vaping Use: Never used  Substance and Sexual Activity  . Alcohol use: Yes    Comment: OCCASIONALLY  . Drug use: No  . Sexual activity: Yes    Partners: Female  Other Topics Concern  . Not on file  Social History Narrative  . Not on file   Social Determinants of Health   Financial Resource Strain: Not on file  Food Insecurity: Not on file  Transportation Needs: Not on file  Physical Activity: Not on file  Stress: Not on file  Social Connections: Not on file  Intimate Partner Violence: Not on file      Review of Systems  Constitutional: Negative for chills, fatigue and unexpected weight change.       Two pound weight gain since most recent visit   HENT: Negative for congestion, postnasal drip, rhinorrhea, sneezing and sore throat.   Respiratory: Negative for cough, chest tightness, shortness of breath and wheezing.   Cardiovascular: Negative for chest pain and palpitations.  Gastrointestinal: Negative for abdominal pain, constipation, diarrhea, nausea  and vomiting.  Endocrine: Negative for cold intolerance, heat intolerance, polydipsia and polyuria.  Musculoskeletal: Negative for arthralgias, back pain, joint swelling and neck pain.  Skin: Negative for rash.  Allergic/Immunologic: Positive for environmental allergies.  Neurological: Negative for dizziness, tremors, numbness and headaches.  Hematological: Negative for adenopathy. Does not bruise/bleed easily.  Psychiatric/Behavioral: Negative for behavioral problems (Depression), sleep disturbance and suicidal ideas. The patient is not  nervous/anxious.     Today's Vitals   06/29/20 0942  BP: (!) 128/94  Pulse: 80  Resp: 16  Temp: (!) 97.3 F (36.3 C)  SpO2: 99%  Weight: 271 lb 9.6 oz (123.2 kg)  Height: 6\' 1"  (1.854 m)   Body mass index is 35.83 kg/m.  Physical Exam Vitals and nursing note reviewed.  Constitutional:      General: He is not in acute distress.    Appearance: Normal appearance. He is well-developed and well-nourished. He is obese. He is not diaphoretic.  HENT:     Head: Normocephalic and atraumatic.     Nose: Nose normal.     Mouth/Throat:     Mouth: Oropharynx is clear and moist.     Pharynx: No oropharyngeal exudate.  Eyes:     Extraocular Movements: EOM normal.     Pupils: Pupils are equal, round, and reactive to light.  Neck:     Thyroid: No thyromegaly.     Vascular: No carotid bruit or JVD.     Trachea: No tracheal deviation.  Cardiovascular:     Rate and Rhythm: Normal rate and regular rhythm.     Heart sounds: Normal heart sounds. No murmur heard. No friction rub. No gallop.   Pulmonary:     Effort: Pulmonary effort is normal. No respiratory distress.     Breath sounds: Normal breath sounds. No wheezing or rales.  Chest:     Chest wall: No tenderness.  Abdominal:     Palpations: Abdomen is soft.  Musculoskeletal:        General: Normal range of motion.     Cervical back: Normal range of motion and neck supple.  Lymphadenopathy:     Cervical: No cervical adenopathy.  Skin:    General: Skin is warm and dry.  Neurological:     Mental Status: He is alert and oriented to person, place, and time.     Cranial Nerves: No cranial nerve deficit.  Psychiatric:        Mood and Affect: Mood and affect and mood normal.        Behavior: Behavior normal.        Thought Content: Thought content normal.        Judgment: Judgment normal.    Assessment/Plan: 1. Essential hypertension Stable. Continue bp medication as prescribed   2. Moderate persistent asthma without  complication Well managed. Continue inhalers and respiratory medication as prescribed  3. BMI 36.0-36.9,adult D/c stimulant appetite suppressant. Trial of saxenda. Sample provided today. Will start on 0.6mg  daily. Instructions provided to gradually titrate dose as tolerated to 3mg  daily. Encouraged him to limit calorie intake to 1500 calories per day. Incorporate exercise into daily routine.   4. Obstructive sleep apnea syndrome Continue regular visits with Dr. 07-05-1969 for CPAP management.   General Counseling: jaxen samples understanding of the findings of todays visit and agrees with plan of treatment. I have discussed any further diagnostic evaluation that may be needed or ordered today. We also reviewed his medications today. he has been encouraged to call the office  with any questions or concerns that should arise related to todays visit.    There is a liability release in patients' chart. There has been a 15 minute discussion about the side effects including but not limited to elevated blood pressure, anxiety, lack of sleep and dry mouth. Pt understands and will like to start/continue on appetite suppressant at this time. There will be one month RX given at the time of visit with proper follow up. Nova diet plan with restricted calories is given to the pt. Pt understands and agrees with  plan of treatment  This patient was seen by Vincent Gros FNP Collaboration with Dr Lyndon Code as a part of collaborative care agreement  Total time spent: 30 Minutes   Time spent includes review of chart, medications, test results, and follow up plan with the patient.      Dr Lyndon Code Internal medicine

## 2020-07-26 DIAGNOSIS — Z6836 Body mass index (BMI) 36.0-36.9, adult: Secondary | ICD-10-CM | POA: Insufficient documentation

## 2020-07-26 DIAGNOSIS — J4540 Moderate persistent asthma, uncomplicated: Secondary | ICD-10-CM | POA: Insufficient documentation

## 2020-07-26 DIAGNOSIS — Z6835 Body mass index (BMI) 35.0-35.9, adult: Secondary | ICD-10-CM | POA: Insufficient documentation

## 2020-07-26 MED ORDER — SAXENDA 18 MG/3ML ~~LOC~~ SOPN: PEN_INJECTOR | SUBCUTANEOUS | 0 refills | Status: DC

## 2020-07-28 ENCOUNTER — Other Ambulatory Visit: Payer: Self-pay | Admitting: Adult Health

## 2020-07-28 DIAGNOSIS — I10 Essential (primary) hypertension: Secondary | ICD-10-CM

## 2020-07-29 ENCOUNTER — Other Ambulatory Visit: Payer: Self-pay

## 2020-07-29 DIAGNOSIS — I10 Essential (primary) hypertension: Secondary | ICD-10-CM

## 2020-07-29 MED ORDER — LISINOPRIL 20 MG PO TABS: 20.0000 mg | ORAL_TABLET | Freq: Every day | ORAL | 1 refills | Status: DC

## 2020-07-31 ENCOUNTER — Encounter: Payer: Self-pay | Admitting: Nurse Practitioner

## 2020-07-31 ENCOUNTER — Other Ambulatory Visit: Payer: Self-pay

## 2020-07-31 ENCOUNTER — Ambulatory Visit (INDEPENDENT_AMBULATORY_CARE_PROVIDER_SITE_OTHER): Payer: BC Managed Care – PPO | Admitting: Nurse Practitioner

## 2020-07-31 VITALS — BP 120/86 | HR 87 | Temp 97.5°F | Resp 16 | Ht 73.0 in | Wt 266.0 lb

## 2020-07-31 DIAGNOSIS — Z6835 Body mass index (BMI) 35.0-35.9, adult: Secondary | ICD-10-CM | POA: Diagnosis not present

## 2020-07-31 DIAGNOSIS — I10 Essential (primary) hypertension: Secondary | ICD-10-CM

## 2020-07-31 DIAGNOSIS — J4540 Moderate persistent asthma, uncomplicated: Secondary | ICD-10-CM | POA: Diagnosis not present

## 2020-07-31 MED ORDER — BUDESONIDE-FORMOTEROL FUMARATE 160-4.5 MCG/ACT IN AERO: 2.0000 | INHALATION_SPRAY | Freq: Two times a day (BID) | RESPIRATORY_TRACT | 5 refills | Status: DC

## 2020-07-31 NOTE — Progress Notes (Signed)
[facility] [address] [place], [place] [address]  Internal MEDICINE  Office Visit Note  Patient Name: Luis Hendrix  [id number]  [id number]  Date of Service: 07/31/2020  Chief Complaint  Patient presents with  . Follow-up  . Hypertension    The patient is here for follow up visit.  -started on Saxenda at his most recent visit.  -has titrated his dose to 1.8mg  daily.  -has had 6 pound weight loss.  -no negative side effects since starting the medication. Has increased energy and less fatigue.  -blood pressure well managed.       Current Medication: Outpatient Encounter Medications as of 07/31/2020  Medication Sig  . albuterol (PROAIR HFA) 108 (90 Base) MCG/ACT inhaler ProAir HFA 90 mcg/actuation aerosol inhaler  . Liraglutide -Weight Management (SAXENDA) 18 MG/3ML SOPN Inject up to three times daily for weight management.  Marland Kitchen lisinopril (ZESTRIL) 20 MG tablet Take 1 tablet (20 mg total) by mouth daily.  . SYMBICORT 160-4.5 MCG/ACT inhaler Inhale 1 puff into the lungs 2 (two) times daily as needed.  . [DISCONTINUED] budesonide-formoterol (SYMBICORT) 160-4.5 MCG/ACT inhaler Inhale 2 puffs into the lungs 2 (two) times daily.  . budesonide-formoterol (SYMBICORT) 160-4.5 MCG/ACT inhaler Inhale 2 puffs into the lungs 2 (two) times daily.   No facility-administered encounter medications on file as of 07/31/2020.    Surgical History: Past Surgical History:  Procedure Laterality Date  . COLONOSCOPY WITH PROPOFOL N/A 02/26/2020   Procedure: COLONOSCOPY WITH PROPOFOL;  Surgeon: Toney Reil, MD;  Location: [facility] ENDOSCOPY;  Service: Gastroenterology;  Laterality: N/A;  . DIAGNOSTIC LAPAROSCOPY    . ELBOW FRACTURE SURGERY Right    Approx age 76  . FRACTURE SURGERY    . LAPAROSCOPIC GASTRIC SLEEVE RESECTION      Medical History: Past Medical History:  Diagnosis Date  . Asthma   . Hypertension   . Sleep apnea     Family History: Family History   Problem Relation Age of Onset  . Hypertension Father     Social History   Socioeconomic History  . Marital status: Married    Spouse name: Not on file  . Number of children: Not on file  . Years of education: Not on file  . Highest education level: Not on file  Occupational History  . Not on file  Tobacco Use  . Smoking status: Never Smoker  . Smokeless tobacco: Never Used  Vaping Use  . Vaping Use: Never used  Substance and Sexual Activity  . Alcohol use: Yes    Comment: OCCASIONALLY  . Drug use: No  . Sexual activity: Yes    Partners: Female  Other Topics Concern  . Not on file  Social History Narrative  . Not on file   Social Determinants of Health   Financial Resource Strain: Not on file  Food Insecurity: Not on file  Transportation Needs: Not on file  Physical Activity: Not on file  Stress: Not on file  Social Connections: Not on file  Intimate Partner Violence: Not on file      Review of Systems  Constitutional: Negative for chills, fatigue and unexpected weight change.       Six pound weight loss since last visit and starting Saxenda  HENT: Negative for congestion, postnasal drip, rhinorrhea, sneezing and sore throat.   Respiratory: Negative for cough, chest tightness, shortness of breath and wheezing.   Cardiovascular: Negative for chest pain and palpitations.  Gastrointestinal: Negative for abdominal pain, constipation, diarrhea,  nausea and vomiting.  Endocrine: Negative for cold intolerance, heat intolerance, polydipsia and polyuria.  Musculoskeletal: Negative for arthralgias, back pain, joint swelling and neck pain.  Skin: Negative for rash.  Allergic/Immunologic: Positive for environmental allergies.  Neurological: Negative for dizziness, tremors, numbness and headaches.  Hematological: Negative for adenopathy. Does not bruise/bleed easily.  Psychiatric/Behavioral: Negative for behavioral problems (Depression), sleep disturbance and suicidal  ideas. The patient is not nervous/anxious.     Today's Vitals   07/31/20 0842  BP: 120/86  Pulse: 87  Resp: 16  Temp: (!) 97.5 F (36.4 C)  SpO2: 97%  Weight: 266 lb (120.7 kg)  Height: 6\' 1"  (1.854 m)   Body mass index is 35.09 kg/m.  Physical Exam Vitals and nursing note reviewed.  Constitutional:      General: He is not in acute distress.    Appearance: Normal appearance. He is well-developed and well-nourished. He is obese. He is not diaphoretic.  HENT:     Head: Normocephalic and atraumatic.     Nose: Nose normal.     Mouth/Throat:     Mouth: Oropharynx is clear and moist.     Pharynx: No oropharyngeal exudate.  Eyes:     Extraocular Movements: EOM normal.     Pupils: Pupils are equal, round, and reactive to light.  Neck:     Thyroid: No thyromegaly.     Vascular: No JVD.     Trachea: No tracheal deviation.  Cardiovascular:     Rate and Rhythm: Normal rate and regular rhythm.     Heart sounds: Normal heart sounds. No murmur heard. No friction rub. No gallop.   Pulmonary:     Effort: Pulmonary effort is normal. No respiratory distress.     Breath sounds: Normal breath sounds. No wheezing or rales.  Chest:     Chest wall: No tenderness.  Abdominal:     Palpations: Abdomen is soft.  Musculoskeletal:        General: Normal range of motion.     Cervical back: Normal range of motion and neck supple.  Lymphadenopathy:     Cervical: No cervical adenopathy.  Skin:    General: Skin is warm and dry.     Capillary Refill: Capillary refill takes less than 2 seconds.  Neurological:     Mental Status: He is alert and oriented to person, place, and time.     Cranial Nerves: No cranial nerve deficit.  Psychiatric:        Mood and Affect: Mood and affect and mood normal.        Behavior: Behavior normal.        Thought Content: Thought content normal.        Judgment: Judgment normal.    Assessment/Plan:  1. Moderate persistent asthma without complication Well  managed. Continue inhalers and respiratory medication as prescribed  - budesonide-formoterol (SYMBICORT) 160-4.5 MCG/ACT inhaler; Inhale 2 puffs into the lungs 2 (two) times daily.  Dispense: 1 each; Refill: 5  2. Essential hypertension Stable. Continue bp medication as prescribed   3. BMI 35.0-35.9,adult Currently on Saxenda 1.8mg  daily. Continue daily and titrate up to 3mg  daily as tolerated. Limit calorie intake to 1500 calories per day and exercising routinely.   General Counseling: kenyetta fife understanding of the findings of todays visit and agrees with plan of treatment. I have discussed any further diagnostic evaluation that may be needed or ordered today. We also reviewed his medications today. he has been encouraged to call the office with  any questions or concerns that should arise related to todays visit.  This patient was seen by Vincent Gros FNP Collaboration with Dr Lyndon Code as a part of collaborative care agreement  Meds ordered this encounter  Medications  . budesonide-formoterol (SYMBICORT) 160-4.5 MCG/ACT inhaler    Sig: Inhale 2 puffs into the lungs 2 (two) times daily.    Dispense:  1 each    Refill:  5    Order Specific Question:   Supervising Provider    Answer:   Lyndon Code [[id number]]    Total time spent: 25 Minutes   Time spent includes review of chart, medications, test results, and follow up plan with the patient.      Dr Lyndon Code Internal medicine

## 2020-11-02 ENCOUNTER — Ambulatory Visit (INDEPENDENT_AMBULATORY_CARE_PROVIDER_SITE_OTHER): Payer: BC Managed Care – PPO | Admitting: Hospice and Palliative Medicine

## 2020-11-02 ENCOUNTER — Other Ambulatory Visit: Payer: Self-pay

## 2020-11-02 ENCOUNTER — Encounter: Payer: Self-pay | Admitting: Hospice and Palliative Medicine

## 2020-11-02 VITALS — BP 131/85 | HR 76 | Temp 97.9°F | Resp 16 | Ht 73.0 in | Wt 281.0 lb

## 2020-11-02 DIAGNOSIS — I10 Essential (primary) hypertension: Secondary | ICD-10-CM | POA: Diagnosis not present

## 2020-11-02 DIAGNOSIS — J4540 Moderate persistent asthma, uncomplicated: Secondary | ICD-10-CM

## 2020-11-02 DIAGNOSIS — Z6837 Body mass index (BMI) 37.0-37.9, adult: Secondary | ICD-10-CM

## 2020-11-02 MED ORDER — ALBUTEROL SULFATE HFA 108 (90 BASE) MCG/ACT IN AERS: INHALATION_SPRAY | RESPIRATORY_TRACT | 5 refills | Status: DC

## 2020-11-02 NOTE — Progress Notes (Signed)
[facility] [address] [place], [place] [address]  Internal MEDICINE  Office Visit Note  Patient Name: Luis Hendrix  [id number]  [id number]  Date of Service: 11/02/2020  Chief Complaint  Patient presents with  . Medical Management of Chronic Issues    Weight management  . Asthma  . Hypertension    HPI Patient is here for routine follow-up At last visits was given samples of Saxenda which was helpful for weight loss but unfortunately his insurance company did not provide coverage for medication His biggest struggle is eating snacks late at night-he is aware that he needs to cut out the late night snacking He remains active daily and throughout the day does try to eat healthy Has had a 15 pound weight gain since last visit in January  BP remains well controlled  Having to use his albuterol inhaler maybe twice per week for asthma, continues to use Symbicort daily, breathing remains well controlled  Current Medication: Outpatient Encounter Medications as of 11/02/2020  Medication Sig  . budesonide-formoterol (SYMBICORT) 160-4.5 MCG/ACT inhaler Inhale 2 puffs into the lungs 2 (two) times daily.  Marland Kitchen lisinopril (ZESTRIL) 20 MG tablet Take 1 tablet (20 mg total) by mouth daily.  . SYMBICORT 160-4.5 MCG/ACT inhaler Inhale 1 puff into the lungs 2 (two) times daily as needed.  . [DISCONTINUED] albuterol (PROAIR HFA) 108 (90 Base) MCG/ACT inhaler ProAir HFA 90 mcg/actuation aerosol inhaler  . albuterol (PROAIR HFA) 108 (90 Base) MCG/ACT inhaler ProAir HFA 90 mcg/actuation aerosol inhaler  . [DISCONTINUED] Liraglutide -Weight Management (SAXENDA) 18 MG/3ML SOPN Inject up to three times daily for weight management.   No facility-administered encounter medications on file as of 11/02/2020.    Surgical History: Past Surgical History:  Procedure Laterality Date  . COLONOSCOPY WITH PROPOFOL N/A 02/26/2020   Procedure: COLONOSCOPY WITH PROPOFOL;  Surgeon: Toney Reil, MD;  Location: [facility] ENDOSCOPY;  Service: Gastroenterology;  Laterality: N/A;  . DIAGNOSTIC LAPAROSCOPY    . ELBOW FRACTURE SURGERY Right    Approx age 32  . FRACTURE SURGERY    . LAPAROSCOPIC GASTRIC SLEEVE RESECTION      Medical History: Past Medical History:  Diagnosis Date  . Asthma   . Hypertension   . Sleep apnea     Family History: Family History  Problem Relation Age of Onset  . Hypertension Father     Social History   Socioeconomic History  . Marital status: Married    Spouse name: Not on file  . Number of children: Not on file  . Years of education: Not on file  . Highest education level: Not on file  Occupational History  . Not on file  Tobacco Use  . Smoking status: Never Smoker  . Smokeless tobacco: Never Used  Vaping Use  . Vaping Use: Never used  Substance and Sexual Activity  . Alcohol use: Yes    Comment: OCCASIONALLY  . Drug use: No  . Sexual activity: Yes    Partners: Female  Other Topics Concern  . Not on file  Social History Narrative  . Not on file   Social Determinants of Health   Financial Resource Strain: Not on file  Food Insecurity: Not on file  Transportation Needs: Not on file  Physical Activity: Not on file  Stress: Not on file  Social Connections: Not on file  Intimate Partner Violence: Not on file      Review of Systems  Constitutional: Negative for chills, fatigue and unexpected  weight change.  HENT: Negative for congestion, postnasal drip, rhinorrhea, sneezing and sore throat.   Eyes: Negative for redness.  Respiratory: Negative for cough, chest tightness and shortness of breath.   Cardiovascular: Negative for chest pain and palpitations.  Gastrointestinal: Negative for abdominal pain, constipation, diarrhea, nausea and vomiting.  Genitourinary: Negative for dysuria and frequency.  Musculoskeletal: Negative for arthralgias, back pain, joint swelling and neck pain.  Skin: Negative for rash.  Neurological:  Negative for tremors and numbness.  Hematological: Negative for adenopathy. Does not bruise/bleed easily.  Psychiatric/Behavioral: Negative for behavioral problems (Depression), sleep disturbance and suicidal ideas. The patient is not nervous/anxious.     Vital Signs: BP 131/85   Pulse 76   Temp 97.9 F (36.6 C)   Resp 16   Ht 6\' 1"  (1.854 m)   Wt 281 lb (127.5 kg)   SpO2 98%   BMI 37.07 kg/m    Physical Exam Vitals reviewed.  Constitutional:      Appearance: Normal appearance. He is obese.  Cardiovascular:     Rate and Rhythm: Normal rate and regular rhythm.     Pulses: Normal pulses.     Heart sounds: Normal heart sounds.  Pulmonary:     Effort: Pulmonary effort is normal.     Breath sounds: Normal breath sounds.  Abdominal:     General: Abdomen is flat.     Palpations: Abdomen is soft.  Musculoskeletal:        General: Normal range of motion.     Cervical back: Normal range of motion.  Skin:    General: Skin is warm.  Neurological:     General: No focal deficit present.     Mental Status: He is alert and oriented to person, place, and time. Mental status is at baseline.  Psychiatric:        Mood and Affect: Mood normal.        Behavior: Behavior normal.        Thought Content: Thought content normal.        Judgment: Judgment normal.    Assessment/Plan: 1. Essential hypertension BP and HR well controlled on lisinopril, continue to monitor  2. Moderate persistent asthma without complication Breathing remains stable, continue with Symbicort daily and albuterol as needed Followed by pulmonology - albuterol (PROAIR HFA) 108 (90 Base) MCG/ACT inhaler; ProAir HFA 90 mcg/actuation aerosol inhaler  Dispense: 18 g; Refill: 5  3. BMI 37.0-37.9, adult Comorbid conditions include HTN as well as OSA Further samples of Saxenda 3 mg given in office today--encouraged to use to help wean off of late night snacking Obesity Counseling: Risk Assessment: An assessment of  behavioral risk factors was made today and includes lack of exercise sedentary lifestyle, lack of portion control and poor dietary habits.  Risk Modification Advice: She was counseled on portion control guidelines. Restricting daily caloric intake to 1800. The detrimental long term effects of obesity on her health and ongoing poor compliance was also discussed with the patient.  General Counseling: brooks kinnan understanding of the findings of todays visit and agrees with plan of treatment. I have discussed any further diagnostic evaluation that may be needed or ordered today. We also reviewed his medications today. he has been encouraged to call the office with any questions or concerns that should arise related to todays visit.   Meds ordered this encounter  Medications  . albuterol (PROAIR HFA) 108 (90 Base) MCG/ACT inhaler    Sig: ProAir HFA 90 mcg/actuation aerosol inhaler  Dispense:  18 g    Refill:  5    Time spent: 30 Minutes Time spent includes review of chart, medications, test results and follow-up plan with the patient.  This patient was seen by Leeanne Deed AGNP-C in Collaboration with Dr Lyndon Code as a part of collaborative care agreement     Lubertha Basque. Shaundra Fullam AGNP-C Internal medicine

## 2021-01-28 ENCOUNTER — Other Ambulatory Visit: Payer: Self-pay | Admitting: Internal Medicine

## 2021-01-28 DIAGNOSIS — I10 Essential (primary) hypertension: Secondary | ICD-10-CM

## 2021-03-08 ENCOUNTER — Ambulatory Visit: Payer: BC Managed Care – PPO | Admitting: Internal Medicine

## 2021-03-08 ENCOUNTER — Other Ambulatory Visit: Payer: Self-pay

## 2021-03-08 ENCOUNTER — Encounter: Payer: Self-pay | Admitting: Internal Medicine

## 2021-03-08 VITALS — BP 142/100 | HR 74 | Temp 98.1°F | Resp 16 | Ht 73.0 in | Wt 282.2 lb

## 2021-03-08 DIAGNOSIS — J4540 Moderate persistent asthma, uncomplicated: Secondary | ICD-10-CM

## 2021-03-08 DIAGNOSIS — Z6836 Body mass index (BMI) 36.0-36.9, adult: Secondary | ICD-10-CM

## 2021-03-08 DIAGNOSIS — R0602 Shortness of breath: Secondary | ICD-10-CM

## 2021-03-08 DIAGNOSIS — Z7189 Other specified counseling: Secondary | ICD-10-CM | POA: Diagnosis not present

## 2021-03-08 DIAGNOSIS — G4733 Obstructive sleep apnea (adult) (pediatric): Secondary | ICD-10-CM | POA: Diagnosis not present

## 2021-03-08 NOTE — Patient Instructions (Signed)

## 2021-03-08 NOTE — Progress Notes (Signed)
[facility] [address] [place], [place] [address]  Pulmonary Sleep Medicine   Office Visit Note  Patient Name: Luis Hendrix. DOB: 1965-12-24 MRN [id number]  Date of Service: 03/08/2021  Complaints/HPI: Asthma has been under better control. He states that he has had some knee problems so has not been exercising as much. Patient states that he has been trying to hold his weight steady as he can. Patient states his asthma has been under control. No major exacerbations. He is on symbicort and abuterol good control. Denies chest pain or tightness. Patient has no cough or fevers noted  ROS  General: (-) fever, (-) chills, (-) night sweats, (-) weakness Skin: (-) rashes, (-) itching,. Eyes: (-) visual changes, (-) redness, (-) itching. Nose and Sinuses: (-) nasal stuffiness or itchiness, (-) postnasal drip, (-) nosebleeds, (-) sinus trouble. Mouth and Throat: (-) sore throat, (-) hoarseness. Neck: (-) swollen glands, (-) enlarged thyroid, (-) neck pain. Respiratory: - cough, (-) bloody sputum, - shortness of breath, - wheezing. Cardiovascular: - ankle swelling, (-) chest pain. Lymphatic: (-) lymph node enlargement. Neurologic: (-) numbness, (-) tingling. Psychiatric: (-) anxiety, (-) depression   Current Medication: Outpatient Encounter Medications as of 03/08/2021  Medication Sig   albuterol (PROAIR HFA) 108 (90 Base) MCG/ACT inhaler ProAir HFA 90 mcg/actuation aerosol inhaler   budesonide-formoterol (SYMBICORT) 160-4.5 MCG/ACT inhaler Inhale 2 puffs into the lungs 2 (two) times daily.   lisinopril (ZESTRIL) 20 MG tablet TAKE ONE TABLET BY MOUTH DAILY   SYMBICORT 160-4.5 MCG/ACT inhaler Inhale 1 puff into the lungs 2 (two) times daily as needed.   No facility-administered encounter medications on file as of 03/08/2021.    Surgical History: Past Surgical History:  Procedure Laterality Date   COLONOSCOPY WITH PROPOFOL N/A 02/26/2020   Procedure: COLONOSCOPY  WITH PROPOFOL;  Surgeon: Toney Reil, MD;  Location: [facility] ENDOSCOPY;  Service: Gastroenterology;  Laterality: N/A;   DIAGNOSTIC LAPAROSCOPY     ELBOW FRACTURE SURGERY Right    Approx age 14   FRACTURE SURGERY     LAPAROSCOPIC GASTRIC SLEEVE RESECTION      Medical History: Past Medical History:  Diagnosis Date   Asthma    Hypertension    Sleep apnea     Family History: Family History  Problem Relation Age of Onset   Hypertension Father     Social History: Social History   Socioeconomic History   Marital status: Married    Spouse name: Not on file   Number of children: Not on file   Years of education: Not on file   Highest education level: Not on file  Occupational History   Not on file  Tobacco Use   Smoking status: Never   Smokeless tobacco: Never  Vaping Use   Vaping Use: Never used  Substance and Sexual Activity   Alcohol use: Yes    Comment: OCCASIONALLY   Drug use: No   Sexual activity: Yes    Partners: Female  Other Topics Concern   Not on file  Social History Narrative   Not on file   Social Determinants of Health   Financial Resource Strain: Not on file  Food Insecurity: Not on file  Transportation Needs: Not on file  Physical Activity: Not on file  Stress: Not on file  Social Connections: Not on file  Intimate Partner Violence: Not on file    Vital Signs: Blood pressure (!) 142/100, pulse 74, temperature 98.1 F (36.7 C), resp. rate 16, height 6'  1" (1.854 m), weight 282 lb 3.2 oz (128 kg), SpO2 96 %.  Examination: General Appearance: The patient is well-developed, well-nourished, and in no distress. Skin: Gross inspection of skin unremarkable. Head: normocephalic, no gross deformities. Eyes: no gross deformities noted. ENT: ears appear grossly normal no exudates. Neck: Supple. No thyromegaly. No LAD. Respiratory: no rhonchi noted at this time. Cardiovascular: Normal S1 and S2 without murmur or rub. Extremities: No cyanosis.  pulses are equal. Neurologic: Alert and oriented. No involuntary movements.  LABS: No results found for this or any previous visit (from the past 2160 hour(s)).  Radiology: No results found.  No results found.  No results found.    Assessment and Plan: Patient Active Problem List   Diagnosis Date Noted   Moderate persistent asthma without complication 07/26/2020   BMI 35.0-35.9,adult 07/26/2020   Encounter for routine adult health examination with abnormal findings 05/24/2020   Dysuria 05/24/2020   Encounter for screening colonoscopy 02/04/2020   Hyperlipidemia LDL goal <100 04/23/2019   Moderate asthma without complication 12/15/2017   Essential hypertension 02/10/2016   Heartburn 02/10/2016   Moderate obesity 02/10/2016   Obstructive sleep apnea syndrome 02/10/2016    1. SOB (shortness of breath) Did spiro today will need follow up PFT - Spirometry with Graph  2. Moderate persistent asthma without complication Well controlled with current regimen. Cleda Daub reviewed PFT ordered  3. BMI 36.0-36.9,adult Obesity Counseling: Had a lengthy discussion regarding patients BMI and weight issues. Patient was instructed on portion control as well as increased activity. Also discussed caloric restrictions with trying to maintain intake less than 2000 Kcal. Discussions were made in accordance with the 5As of weight management. Simple actions such as not eating late and if able to, taking a walk is suggested.   4. Obstructive sleep apnea syndrome On PAP therapy has good compliance reported  5. CPAP use counseling CPAP Counseling: had a lengthy discussion with the patient regarding the importance of PAP therapy in management of the sleep apnea. Patient appears to understand the risk factor reduction and also understands the risks associated with untreated sleep apnea. Patient will try to make a good faith effort to remain compliant with therapy. Also instructed the patient on proper  cleaning of the device including the water must be changed daily if possible and use of distilled water is preferred. Patient understands that the machine should be regularly cleaned with appropriate recommended cleaning solutions that do not damage the PAP machine for example given white vinegar and water rinses. Other methods such as ozone treatment may not be as good as these simple methods to achieve cleaning.    General Counseling: I have discussed the findings of the evaluation and examination with Greggory Stallion.  I have also discussed any further diagnostic evaluation thatmay be needed or ordered today. Rahsaan verbalizes understanding of the findings of todays visit. We also reviewed his medications today and discussed drug interactions and side effects including but not limited excessive drowsiness and altered mental states. We also discussed that there is always a risk not just to him but also people around him. he has been encouraged to call the office with any questions or concerns that should arise related to todays visit.  Orders Placed This Encounter  Procedures   Spirometry with Graph    Order Specific Question:   Where should this test be performed?    Answer:   [facility]    Order Specific Question:   Basic spirometry    Answer:  Yes    Order Specific Question:   Spirometry pre & post bronchodilator    Answer:   No   Pulmonary function test    Standing Status:   Future    Standing Expiration Date:   03/08/2022    Order Specific Question:   Where should this test be performed?    Answer:   [facility]     Time spent: 89  I have personally obtained a history, examined the patient, evaluated laboratory and imaging results, formulated the assessment and plan and placed orders.    Yevonne Pax, MD [facility] Pulmonary and Critical Care Sleep medicine

## 2021-03-24 ENCOUNTER — Other Ambulatory Visit: Payer: Self-pay

## 2021-03-24 ENCOUNTER — Ambulatory Visit (INDEPENDENT_AMBULATORY_CARE_PROVIDER_SITE_OTHER): Payer: BC Managed Care – PPO | Admitting: Internal Medicine

## 2021-03-24 DIAGNOSIS — R0602 Shortness of breath: Secondary | ICD-10-CM | POA: Diagnosis not present

## 2021-03-24 LAB — PULMONARY FUNCTION TEST

## 2021-03-25 ENCOUNTER — Ambulatory Visit (INDEPENDENT_AMBULATORY_CARE_PROVIDER_SITE_OTHER): Payer: BC Managed Care – PPO | Admitting: Internal Medicine

## 2021-03-25 ENCOUNTER — Encounter: Payer: Self-pay | Admitting: Internal Medicine

## 2021-03-25 VITALS — BP 140/88 | HR 80 | Temp 97.8°F | Resp 16 | Ht 73.0 in | Wt 281.0 lb

## 2021-03-25 DIAGNOSIS — J4540 Moderate persistent asthma, uncomplicated: Secondary | ICD-10-CM

## 2021-03-25 DIAGNOSIS — G4733 Obstructive sleep apnea (adult) (pediatric): Secondary | ICD-10-CM | POA: Diagnosis not present

## 2021-03-25 DIAGNOSIS — Z7189 Other specified counseling: Secondary | ICD-10-CM

## 2021-03-25 DIAGNOSIS — Z6836 Body mass index (BMI) 36.0-36.9, adult: Secondary | ICD-10-CM

## 2021-03-25 NOTE — Patient Instructions (Signed)
Asthma, Adult  Asthma is a long-term (chronic) condition in which the airways get tight and narrow. The airways are the breathing passages that lead from the nose and mouth down into the lungs. A person with asthma will have times when symptoms get worse. These are called asthma attacks. They can cause coughing, whistling sounds when you breathe (wheezing), shortness of breath, and chest pain. They can make it hard to breathe. Thereis no cure for asthma, but medicines and lifestyle changes can help control it. There are many things that can bring on an asthma attack or make asthma symptoms worse (triggers). Common triggers include: Mold. Dust. Cigarette smoke. Cockroaches. Things that can cause allergy symptoms (allergens). These include animal skin flakes (dander) and pollen from trees or grass. Things that pollute the air. These may include household cleaners, wood smoke, smog, or chemical odors. Cold air, weather changes, and wind. Crying or laughing hard. Stress. Certain medicines or drugs. Certain foods such as dried fruit, potato chips, and grape juice. Infections, such as a cold or the flu. Certain medical conditions or diseases. Exercise or tiring activities. Asthma may be treated with medicines and by staying away from the things that cause asthma attacks. Types of medicines may include: Controller medicines. These help prevent asthma symptoms. They are usually taken every day. Fast-acting reliever or rescue medicines. These quickly relieve asthma symptoms. They are used as needed and provide short-term relief. Allergy medicines if your attacks are brought on by allergens. Medicines to help control the body's defense (immune) system. Follow these instructions at home: Avoiding triggers in your home Change your heating and air conditioning filter often. Limit your use of fireplaces and wood stoves. Get rid of pests (such as roaches and mice) and their droppings. Throw away plants  if you see mold on them. Clean your floors. Dust regularly. Use cleaning products that do not smell. Have someone vacuum when you are not home. Use a vacuum cleaner with a HEPA filter if possible. Replace carpet with wood, tile, or vinyl flooring. Carpet can trap animal skin flakes and dust. Use allergy-proof pillows, mattress covers, and box spring covers. Wash bed sheets and blankets every week in hot water. Dry them in a dryer. Keep your bedroom free of any triggers. Avoid pets and keep windows closed when things that cause allergy symptoms are in the air. Use blankets that are made of polyester or cotton. Clean bathrooms and kitchens with bleach. If possible, have someone repaint the walls in these rooms with mold-resistant paint. Keep out of the rooms that are being cleaned and painted. Wash your hands often with soap and water. If soap and water are not available, use hand sanitizer. Do not allow anyone to smoke in your home. General instructions Take over-the-counter and prescription medicines only as told by your doctor. Talk with your doctor if you have questions about how or when to take your medicines. Make note if you need to use your medicines more often than usual. Do not use any products that contain nicotine or tobacco, such as cigarettes and e-cigarettes. If you need help quitting, ask your doctor. Stay away from secondhand smoke. Avoid doing things outdoors when allergen counts are high and when air quality is low. Wear a ski mask when doing outdoor activities in the winter. The mask should cover your nose and mouth. Exercise indoors on cold days if you can. Warm up before you exercise. Take time to cool down after exercise. Use a peak flow meter as   told by your doctor. A peak flow meter is a tool that measures how well the lungs are working. Keep track of the peak flow meter's readings. Write them down. Follow your asthma action plan. This is a written plan for taking care  of your asthma and treating your attacks. Make sure you get all the shots (vaccines) that your doctor recommends. Ask your doctor about a flu shot and a pneumonia shot. Keep all follow-up visits as told by your doctor. This is important. Contact a doctor if: You have wheezing, shortness of breath, or a cough even while taking medicine to prevent attacks. The mucus you cough up (sputum) is thicker than usual. The mucus you cough up changes from clear or white to yellow, green, gray, or bloody. You have problems from the medicine you are taking, such as: A rash. Itching. Swelling. Trouble breathing. You need reliever medicines more than 2-3 times a week. Your peak flow reading is still at 50-79% of your personal best after following the action plan for 1 hour. You have a fever. Get help right away if: You seem to be worse and are not responding to medicine during an asthma attack. You are short of breath even at rest. You get short of breath when doing very little activity. You have trouble eating, drinking, or talking. You have chest pain or tightness. You have a fast heartbeat. Your lips or fingernails start to turn blue. You are light-headed or dizzy, or you faint. Your peak flow is less than 50% of your personal best. You feel too tired to breathe normally. Summary Asthma is a long-term (chronic) condition in which the airways get tight and narrow. An asthma attack can make it hard to breathe. Asthma cannot be cured, but medicines and lifestyle changes can help control it. Make sure you understand how to avoid triggers and how and when to use your medicines. This information is not intended to replace advice given to you by your health care provider. Make sure you discuss any questions you have with your healthcare provider. Document Revised: 11/06/2019 Document Reviewed: 11/06/2019 Elsevier Patient Education  2022 [employer]  

## 2021-03-25 NOTE — Progress Notes (Signed)
[facility] [address] [place], [place] [address]  Pulmonary Sleep Medicine   Office Visit Note  Patient Name: Luis Hendrix. DOB: [birth year] MRN [id number]  Date of Service: 03/25/2021  Complaints/HPI: Asthma. He has been under farily good control with his asthma. Patient states he is using symbicort and ventolin as a rescue.  States that on occasion he still has some significant shortness of breath but the inhalers do seem to help him.  Denies having any chest pain no palpitations.  Denies fevers or chills.  No recent admissions to the hospital.  ROS  General: (-) fever, (-) chills, (-) night sweats, (-) weakness Skin: (-) rashes, (-) itching,. Eyes: (-) visual changes, (-) redness, (-) itching. Nose and Sinuses: (-) nasal stuffiness or itchiness, (-) postnasal drip, (-) nosebleeds, (-) sinus trouble. Mouth and Throat: (-) sore throat, (-) hoarseness. Neck: (-) swollen glands, (-) enlarged thyroid, (-) neck pain. Respiratory: - cough, (-) bloody sputum, + shortness of breath, - wheezing. Cardiovascular: - ankle swelling, (-) chest pain. Lymphatic: (-) lymph node enlargement. Neurologic: (-) numbness, (-) tingling. Psychiatric: (-) anxiety, (-) depression   Current Medication: Outpatient Encounter Medications as of 03/25/2021  Medication Sig   albuterol (PROAIR HFA) 108 (90 Base) MCG/ACT inhaler ProAir HFA 90 mcg/actuation aerosol inhaler   budesonide-formoterol (SYMBICORT) 160-4.5 MCG/ACT inhaler Inhale 2 puffs into the lungs 2 (two) times daily.   lisinopril (ZESTRIL) 20 MG tablet TAKE ONE TABLET BY MOUTH DAILY   SYMBICORT 160-4.5 MCG/ACT inhaler Inhale 1 puff into the lungs 2 (two) times daily as needed.   No facility-administered encounter medications on file as of 03/25/2021.    Surgical History: Past Surgical History:  Procedure Laterality Date   COLONOSCOPY WITH PROPOFOL N/A 02/26/2020   Procedure: COLONOSCOPY WITH PROPOFOL;  Surgeon: Toney Reil, MD;  Location: [facility] ENDOSCOPY;  Service: Gastroenterology;  Laterality: N/A;   DIAGNOSTIC LAPAROSCOPY     ELBOW FRACTURE SURGERY Right    Approx age 7   FRACTURE SURGERY     LAPAROSCOPIC GASTRIC SLEEVE RESECTION      Medical History: Past Medical History:  Diagnosis Date   Asthma    Hypertension    Sleep apnea     Family History: Family History  Problem Relation Age of Onset   Hypertension Father     Social History: Social History   Socioeconomic History   Marital status: Married    Spouse name: Not on file   Number of children: Not on file   Years of education: Not on file   Highest education level: Not on file  Occupational History   Not on file  Tobacco Use   Smoking status: Never   Smokeless tobacco: Never  Vaping Use   Vaping Use: Never used  Substance and Sexual Activity   Alcohol use: Yes    Comment: OCCASIONALLY   Drug use: No   Sexual activity: Yes    Partners: Female  Other Topics Concern   Not on file  Social History Narrative   Not on file   Social Determinants of Health   Financial Resource Strain: Not on file  Food Insecurity: Not on file  Transportation Needs: Not on file  Physical Activity: Not on file  Stress: Not on file  Social Connections: Not on file  Intimate Partner Violence: Not on file    Vital Signs: Blood pressure 140/88, pulse 80, temperature 97.8 F (36.6 C), resp. rate 16, height 6\' 1"  (1.854 m), weight 281 lb (  127.5 kg), SpO2 98 %.  Examination: General Appearance: The patient is well-developed, well-nourished, and in no distress. Skin: Gross inspection of skin unremarkable. Head: normocephalic, no gross deformities. Eyes: no gross deformities noted. ENT: ears appear grossly normal no exudates. Neck: Supple. No thyromegaly. No LAD. Respiratory: no rhonchi noted . Cardiovascular: Normal S1 and S2 without murmur or rub. Extremities: No cyanosis. pulses are equal. Neurologic: Alert and oriented. No  involuntary movements.  LABS: No results found for this or any previous visit (from the past 2160 hour(s)).  Radiology: No results found.  No results found.  No results found.    Assessment and Plan: Patient Active Problem List   Diagnosis Date Noted   Moderate persistent asthma without complication 07/26/2020   BMI 35.0-35.9,adult 07/26/2020   Encounter for routine adult health examination with abnormal findings 05/24/2020   Dysuria 05/24/2020   Encounter for screening colonoscopy 02/04/2020   Hyperlipidemia LDL goal <100 04/23/2019   Moderate asthma without complication 12/15/2017   Essential hypertension 02/10/2016   Heartburn 02/10/2016   Moderate obesity 02/10/2016   Obstructive sleep apnea syndrome 02/10/2016    1. Moderate persistent asthma without complication Seems to be under good control we will continue with current Recommendations.  Continue albuterol as well as the Symbicort  2. BMI 36.0-36.9,adult Obesity Counseling: Had a lengthy discussion regarding patients BMI and weight issues. Patient was instructed on portion control as well as increased activity. Also discussed caloric restrictions with trying to maintain intake less than 2000 Kcal. Discussions were made in accordance with the 5As of weight management. Simple actions such as not eating late and if able to, taking a walk is suggested.   3. Obstructive sleep apnea syndrome Patient is on PAP therapy continue with current pressure setting seems to be well controlled.  4. CPAP use counseling CPAP Counseling: had a lengthy discussion with the patient regarding the importance of PAP therapy in management of the sleep apnea. Patient appears to understand the risk factor reduction and also understands the risks associated with untreated sleep apnea. Patient will try to make a good faith effort to remain compliant with therapy. Also instructed the patient on proper cleaning of the device including the water must  be changed daily if possible and use of distilled water is preferred. Patient understands that the machine should be regularly cleaned with appropriate recommended cleaning solutions that do not damage the PAP machine for example given white vinegar and water rinses. Other methods such as ozone treatment may not be as good as these simple methods to achieve cleaning.     General Counseling: I have discussed the findings of the evaluation and examination with Greggory Stallion.  I have also discussed any further diagnostic evaluation thatmay be needed or ordered today. Zarin verbalizes understanding of the findings of todays visit. We also reviewed his medications today and discussed drug interactions and side effects including but not limited excessive drowsiness and altered mental states. We also discussed that there is always a risk not just to him but also people around him. he has been encouraged to call the office with any questions or concerns that should arise related to todays visit.  Orders Placed This Encounter  Procedures   Allergy Test    Order Specific Question:   Allergy test to perform    Answer:   southeast     Time spent: 19  I have personally obtained a history, examined the patient, evaluated laboratory and imaging results, formulated the assessment and plan  and placed orders.    Allyne Gee, MD [facility] Pulmonary and Critical Care Sleep medicine

## 2021-04-01 NOTE — Procedures (Signed)
[facility] [address] [place], [address]    Complete Pulmonary Function Testing Interpretation:  FINDINGS:  Forced vital capacity is normal.  FEV1 is 2.67 L which is 74% of predicted and is mildly decreased.  F1 FVC ratio is mildly decreased.  Total lung capacity was mildly decreased.  Residual volume is decreased residual internal breast ratio was increased.  Postbronchodilator there was no significant change in FEV1.  DLCO was increased.  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  Yevonne Pax, MD [facility] Pulmonary Critical Care Medicine Sleep Medicine

## 2021-05-06 ENCOUNTER — Ambulatory Visit (INDEPENDENT_AMBULATORY_CARE_PROVIDER_SITE_OTHER): Payer: BC Managed Care – PPO | Admitting: Physician Assistant

## 2021-05-06 ENCOUNTER — Encounter: Payer: Self-pay | Admitting: Physician Assistant

## 2021-05-06 ENCOUNTER — Other Ambulatory Visit: Payer: Self-pay

## 2021-05-06 DIAGNOSIS — E669 Obesity, unspecified: Secondary | ICD-10-CM

## 2021-05-06 DIAGNOSIS — I10 Essential (primary) hypertension: Secondary | ICD-10-CM | POA: Diagnosis not present

## 2021-05-06 DIAGNOSIS — J4540 Moderate persistent asthma, uncomplicated: Secondary | ICD-10-CM | POA: Diagnosis not present

## 2021-05-06 DIAGNOSIS — R5383 Other fatigue: Secondary | ICD-10-CM

## 2021-05-06 DIAGNOSIS — Z6839 Body mass index (BMI) 39.0-39.9, adult: Secondary | ICD-10-CM | POA: Diagnosis not present

## 2021-05-06 DIAGNOSIS — Z0001 Encounter for general adult medical examination with abnormal findings: Secondary | ICD-10-CM | POA: Diagnosis not present

## 2021-05-06 DIAGNOSIS — Z125 Encounter for screening for malignant neoplasm of prostate: Secondary | ICD-10-CM

## 2021-05-06 DIAGNOSIS — R300 Dysuria: Secondary | ICD-10-CM

## 2021-05-06 NOTE — Progress Notes (Signed)
[facility] [address] [place], [place] [address]  Internal MEDICINE  Office Visit Note  Patient Name: Luis Hendrix  [id number]  [id number]  Date of Service: 05/07/2021  Chief Complaint  Patient presents with   Annual Exam   Sleep Apnea   Hypertension   Asthma     HPI Pt is here for routine health maintenance examination -Bp not checked at home, but will start monitoring now -breathing has been stable, he is followed by pulmonology -Declined flu shot -Sleep has been good, lots of energy. Stopped cpap about 2 years ago after weight loss surgery. Weight does go up and down some. Used to take phentermine but not currently. -Diet has been ok. Weakness is late night snacks. Wife tries to not buy popcorn and snacks for this reason. -4 days a week does boot camp class, spin class, and also walks regularly -Due for routine fasting labs -UTD on colonoscopy  Current Medication: Outpatient Encounter Medications as of 05/06/2021  Medication Sig   albuterol (PROAIR HFA) 108 (90 Base) MCG/ACT inhaler ProAir HFA 90 mcg/actuation aerosol inhaler   budesonide-formoterol (SYMBICORT) 160-4.5 MCG/ACT inhaler Inhale 2 puffs into the lungs 2 (two) times daily.   lisinopril (ZESTRIL) 20 MG tablet TAKE ONE TABLET BY MOUTH DAILY   SYMBICORT 160-4.5 MCG/ACT inhaler Inhale 1 puff into the lungs 2 (two) times daily as needed.   No facility-administered encounter medications on file as of 05/06/2021.    Surgical History: Past Surgical History:  Procedure Laterality Date   COLONOSCOPY WITH PROPOFOL N/A 02/26/2020   Procedure: COLONOSCOPY WITH PROPOFOL;  Surgeon: Toney Reil, MD;  Location: [facility] ENDOSCOPY;  Service: Gastroenterology;  Laterality: N/A;   DIAGNOSTIC LAPAROSCOPY     ELBOW FRACTURE SURGERY Right    Approx age 5   FRACTURE SURGERY     LAPAROSCOPIC GASTRIC SLEEVE RESECTION      Medical History: Past Medical History:  Diagnosis Date   Asthma     Hypertension    Sleep apnea     Family History: Family History  Problem Relation Age of Onset   Hypertension Father       Review of Systems  Constitutional:  Negative for chills, fatigue and unexpected weight change.  HENT:  Negative for congestion, postnasal drip, rhinorrhea, sneezing and sore throat.   Eyes:  Negative for redness.  Respiratory:  Negative for cough, chest tightness and shortness of breath.   Cardiovascular:  Negative for chest pain and palpitations.  Gastrointestinal:  Negative for abdominal pain, constipation, diarrhea, nausea and vomiting.  Genitourinary:  Negative for dysuria and frequency.  Musculoskeletal:  Negative for arthralgias, back pain, joint swelling and neck pain.  Skin:  Negative for rash.  Neurological:  Negative for tremors and numbness.  Hematological:  Negative for adenopathy. Does not bruise/bleed easily.  Psychiatric/Behavioral:  Negative for behavioral problems (Depression), sleep disturbance and suicidal ideas. The patient is not nervous/anxious.     Vital Signs: BP 138/90 Comment: 130/96  Pulse 82   Temp 98.3 F (36.8 C)   Resp 16   Ht 6\' 1"  (1.854 m)   Wt 283 lb 3.2 oz (128.5 kg)   SpO2 98%   BMI 37.36 kg/m    Physical Exam Vitals and nursing note reviewed.  Constitutional:      General: He is not in acute distress.    Appearance: He is well-developed. He is obese. He is not diaphoretic.  HENT:     Head: Normocephalic and atraumatic.  Right Ear: External ear normal.     Left Ear: External ear normal.     Nose: Nose normal.     Mouth/Throat:     Pharynx: No oropharyngeal exudate.  Eyes:     General: No scleral icterus.       Right eye: No discharge.        Left eye: No discharge.     Conjunctiva/sclera: Conjunctivae normal.     Pupils: Pupils are equal, round, and reactive to light.  Neck:     Thyroid: No thyromegaly.     Vascular: No JVD.     Trachea: No tracheal deviation.  Cardiovascular:     Rate and  Rhythm: Normal rate and regular rhythm.     Heart sounds: Normal heart sounds. No murmur heard.   No friction rub. No gallop.  Pulmonary:     Effort: Pulmonary effort is normal. No respiratory distress.     Breath sounds: Normal breath sounds. No stridor. No wheezing or rales.  Chest:     Chest wall: No tenderness.  Abdominal:     General: Bowel sounds are normal. There is no distension.     Palpations: Abdomen is soft. There is no mass.     Tenderness: There is no abdominal tenderness. There is no guarding or rebound.  Musculoskeletal:        General: No tenderness or deformity. Normal range of motion.     Cervical back: Normal range of motion and neck supple.  Lymphadenopathy:     Cervical: No cervical adenopathy.  Skin:    General: Skin is warm and dry.     Coloration: Skin is not pale.     Findings: No erythema or rash.  Neurological:     Mental Status: He is alert.     Cranial Nerves: No cranial nerve deficit.     Motor: No abnormal muscle tone.     Coordination: Coordination normal.     Deep Tendon Reflexes: Reflexes are normal and symmetric.  Psychiatric:        Behavior: Behavior normal.        Thought Content: Thought content normal.        Judgment: Judgment normal.     LABS: Recent Results (from the past 2160 hour(s))  Pulmonary function test     Status: None   Collection Time: 03/24/21  1:30 PM  Result Value Ref Range   FEV1     FVC     FEV1/FVC     TLC     DLCO    UA/M w/rflx Culture, Routine     Status: None   Collection Time: 05/06/21  4:58 PM   Specimen: Urine   Urine  Result Value Ref Range   Specific Gravity, UA 1.017 1.005 - 1.030   pH, UA 6.0 5.0 - 7.5   Color, UA Yellow Yellow   Appearance Ur Clear Clear   Leukocytes,UA Negative Negative   Protein,UA Negative Negative/Trace   Glucose, UA Negative Negative   Ketones, UA Negative Negative   RBC, UA Negative Negative   Bilirubin, UA Negative Negative   Urobilinogen, Ur 1.0 0.2 - 1.0 mg/dL    Nitrite, UA Negative Negative   Microscopic Examination Comment     Comment: Microscopic follows if indicated.   Microscopic Examination See below:     Comment: Microscopic was indicated and was performed.   Urinalysis Reflex Comment     Comment: This specimen will not reflex to a Urine Culture.  Microscopic  Examination     Status: None   Collection Time: 05/06/21  4:58 PM   Urine  Result Value Ref Range   WBC, UA None seen 0 - 5 /hpf   RBC None seen 0 - 2 /hpf   Epithelial Cells (non renal) None seen 0 - 10 /hpf   Casts None seen None seen /lpf   Bacteria, UA None seen None seen/Few        Assessment/Plan: 1. Encounter for general adult medical examination with abnormal findings CPE performed, routine fasting labs ordered, up-to-date on colonoscopy  2. Essential hypertension Stable, continue current medication and begin monitoring at home  3. Moderate persistent asthma without complication Stable, followed by pulmonology.  Continue inhalers as prescribed and as indicated  4. Special screening for malignant neoplasm of prostate - PSA Total (Reflex To Free)  5. Other fatigue - CBC w/Diff/Platelet - Comprehensive metabolic panel - Lipid Panel With LDL/HDL Ratio - TSH + free T4  6. Dysuria - UA/M w/rflx Culture, Routine  7. Obesity (BMI 30-39.9) Obesity Counseling: Had a lengthy discussion regarding patients BMI and weight issues. Patient was instructed on portion control as well as increased activity. Also discussed caloric restrictions with trying to maintain intake less than 2000 Kcal. Discussions were made in accordance with the 5As of weight management. Simple actions such as not eating late and if able to, taking a walk is suggested.    General Counseling: bonner larue understanding of the findings of todays visit and agrees with plan of treatment. I have discussed any further diagnostic evaluation that may be needed or ordered today. We also reviewed his  medications today. he has been encouraged to call the office with any questions or concerns that should arise related to todays visit.    Counseling:    Orders Placed This Encounter  Procedures   Microscopic Examination   UA/M w/rflx Culture, Routine   CBC w/Diff/Platelet   Comprehensive metabolic panel   Lipid Panel With LDL/HDL Ratio   PSA Total (Reflex To Free)   TSH + free T4    No orders of the defined types were placed in this encounter.   This patient was seen by Lynn Ito, PA-C in collaboration with Dr. Beverely Risen as a part of collaborative care agreement.  Total time spent:30 Minutes  Time spent includes review of chart, medications, test results, and follow up plan with the patient.     Lyndon Code, MD  Internal Medicine

## 2021-05-07 LAB — UA/M W/RFLX CULTURE, ROUTINE
Bilirubin, UA: NEGATIVE
Glucose, UA: NEGATIVE
Ketones, UA: NEGATIVE
Leukocytes,UA: NEGATIVE
Nitrite, UA: NEGATIVE
Protein,UA: NEGATIVE
RBC, UA: NEGATIVE
Specific Gravity, UA: 1.017 (ref 1.005–1.030)
Urobilinogen, Ur: 1 mg/dL (ref 0.2–1.0)
pH, UA: 6 (ref 5.0–7.5)

## 2021-05-07 LAB — MICROSCOPIC EXAMINATION
Bacteria, UA: NONE SEEN
Casts: NONE SEEN /lpf
Epithelial Cells (non renal): NONE SEEN /hpf (ref 0–10)
RBC, Urine: NONE SEEN /hpf (ref 0–2)
WBC, UA: NONE SEEN /hpf (ref 0–5)

## 2021-06-05 LAB — CBC WITH DIFFERENTIAL/PLATELET
Basophils Absolute: 0 10*3/uL (ref 0.0–0.2)
Basos: 1 %
EOS (ABSOLUTE): 0.1 10*3/uL (ref 0.0–0.4)
Eos: 4 %
Hematocrit: 45.3 % (ref 37.5–51.0)
Hemoglobin: 14.6 g/dL (ref 13.0–17.7)
Immature Grans (Abs): 0 10*3/uL (ref 0.0–0.1)
Immature Granulocytes: 0 %
Lymphocytes Absolute: 1.3 10*3/uL (ref 0.7–3.1)
Lymphs: 36 %
MCH: 27.1 pg (ref 26.6–33.0)
MCHC: 32.2 g/dL (ref 31.5–35.7)
MCV: 84 fL (ref 79–97)
Monocytes Absolute: 0.3 10*3/uL (ref 0.1–0.9)
Monocytes: 7 %
Neutrophils Absolute: 1.9 10*3/uL (ref 1.4–7.0)
Neutrophils: 52 %
Platelets: 213 10*3/uL (ref 150–450)
RBC: 5.39 x10E6/uL (ref 4.14–5.80)
RDW: 13 % (ref 11.6–15.4)
WBC: 3.7 10*3/uL (ref 3.4–10.8)

## 2021-06-05 LAB — COMPREHENSIVE METABOLIC PANEL
ALT: 25 IU/L (ref 0–44)
AST: 32 IU/L (ref 0–40)
Albumin/Globulin Ratio: 2.1 (ref 1.2–2.2)
Albumin: 4.6 g/dL (ref 3.8–4.9)
Alkaline Phosphatase: 67 IU/L (ref 44–121)
BUN/Creatinine Ratio: 15 (ref 9–20)
BUN: 19 mg/dL (ref 6–24)
Bilirubin Total: 0.5 mg/dL (ref 0.0–1.2)
CO2: 23 mmol/L (ref 20–29)
Calcium: 9.6 mg/dL (ref 8.7–10.2)
Chloride: 99 mmol/L (ref 96–106)
Creatinine, Ser: 1.28 mg/dL — ABNORMAL HIGH (ref 0.76–1.27)
Globulin, Total: 2.2 g/dL (ref 1.5–4.5)
Glucose: 90 mg/dL (ref 70–99)
Potassium: 4.4 mmol/L (ref 3.5–5.2)
Sodium: 138 mmol/L (ref 134–144)
Total Protein: 6.8 g/dL (ref 6.0–8.5)
eGFR: 67 mL/min/{1.73_m2} (ref >59)

## 2021-06-05 LAB — LIPID PANEL WITH LDL/HDL RATIO
Cholesterol, Total: 210 mg/dL — ABNORMAL HIGH (ref 100–199)
HDL: 65 mg/dL (ref >39)
LDL Chol Calc (NIH): 136 mg/dL — ABNORMAL HIGH (ref 0–99)
LDL/HDL Ratio: 2.1 ratio (ref 0.0–3.6)
Triglycerides: 51 mg/dL (ref 0–149)
VLDL Cholesterol Cal: 9 mg/dL (ref 5–40)

## 2021-06-05 LAB — TSH+FREE T4
Free T4: 1.2 ng/dL (ref 0.82–1.77)
TSH: 2.06 u[IU]/mL (ref 0.450–4.500)

## 2021-06-05 LAB — PSA TOTAL (REFLEX TO FREE): Prostate Specific Ag, Serum: 0.8 ng/mL (ref 0.0–4.0)

## 2021-06-09 ENCOUNTER — Telehealth: Payer: Self-pay

## 2021-06-09 NOTE — Telephone Encounter (Signed)
Called Pt no answer, LMOM informing him of lab results and to work on his diet and staying well hydrated and will discuss maybe starting medication and his labs at his next office visit

## 2021-06-09 NOTE — Telephone Encounter (Signed)
-----   Message from Carlean Jews, PA-C sent at 06/09/2021  1:05 PM EST ----- Please let pt know his labs overall looked ok except for an elevated creatinine and elevated cholesterol. We can discuss further at his next visit, but he should work on improving his diet and staying well hydrated. We may discuss starting medication

## 2021-09-09 ENCOUNTER — Ambulatory Visit: Payer: BC Managed Care – PPO | Admitting: Physician Assistant

## 2021-09-09 ENCOUNTER — Other Ambulatory Visit: Payer: Self-pay

## 2021-09-09 ENCOUNTER — Encounter: Payer: Self-pay | Admitting: Physician Assistant

## 2021-09-09 VITALS — BP 140/90 | HR 78 | Temp 98.4°F | Resp 16 | Ht 73.0 in | Wt 284.0 lb

## 2021-09-09 DIAGNOSIS — E669 Obesity, unspecified: Secondary | ICD-10-CM

## 2021-09-09 DIAGNOSIS — I10 Essential (primary) hypertension: Secondary | ICD-10-CM | POA: Diagnosis not present

## 2021-09-09 DIAGNOSIS — E785 Hyperlipidemia, unspecified: Secondary | ICD-10-CM | POA: Diagnosis not present

## 2021-09-09 MED ORDER — TOPIRAMATE 25 MG PO TABS: 25.0000 mg | ORAL_TABLET | Freq: Two times a day (BID) | ORAL | 1 refills | Status: DC

## 2021-09-09 MED ORDER — ROSUVASTATIN CALCIUM 5 MG PO TABS: 5.0000 mg | ORAL_TABLET | Freq: Every day | ORAL | 3 refills | Status: DC

## 2021-09-09 NOTE — Progress Notes (Signed)
[facility] [address] [place], [place] [address]  Internal MEDICINE  Office Visit Note  Patient Name: Luis Hendrix  [id number]  [id number]  Date of Service: 09/09/2021  Chief Complaint  Patient presents with   Follow-up   Hypertension   Quality Metric Gaps    Shingles Vaccine     HPI Pt is here for routine follow up -BP was 138/90 at home this AM. Will continue to monitor as it is borderline in office today as well -Previously discussed labs over the phone in regards to high cholesterol and borderline creatinine -trying to work on diet, no longer buying popcorn as a late night snack that he states is usually his biggest problem.  -Does spin class wed and Fri, boot camp T/Th and walks on weekend. -Interested in weight loss medication, discussed not going back on phentermine given his BP and trying alternative which he is ok with  Current Medication: Outpatient Encounter Medications as of 09/09/2021  Medication Sig   albuterol (PROAIR HFA) 108 (90 Base) MCG/ACT inhaler ProAir HFA 90 mcg/actuation aerosol inhaler   budesonide-formoterol (SYMBICORT) 160-4.5 MCG/ACT inhaler Inhale 2 puffs into the lungs 2 (two) times daily.   lisinopril (ZESTRIL) 20 MG tablet TAKE ONE TABLET BY MOUTH DAILY   rosuvastatin (CRESTOR) 5 MG tablet Take 1 tablet (5 mg total) by mouth daily.   SYMBICORT 160-4.5 MCG/ACT inhaler Inhale 1 puff into the lungs 2 (two) times daily as needed.   topiramate (TOPAMAX) 25 MG tablet Take 1 tablet (25 mg total) by mouth 2 (two) times daily.   No facility-administered encounter medications on file as of 09/09/2021.    Surgical History: Past Surgical History:  Procedure Laterality Date   COLONOSCOPY WITH PROPOFOL N/A 02/26/2020   Procedure: COLONOSCOPY WITH PROPOFOL;  Surgeon: Toney Reil, MD;  Location: [facility] ENDOSCOPY;  Service: Gastroenterology;  Laterality: N/A;   DIAGNOSTIC LAPAROSCOPY     ELBOW FRACTURE SURGERY Right    Approx age  19   FRACTURE SURGERY     LAPAROSCOPIC GASTRIC SLEEVE RESECTION      Medical History: Past Medical History:  Diagnosis Date   Asthma    Hypertension    Sleep apnea     Family History: Family History  Problem Relation Age of Onset   Hypertension Father     Social History   Socioeconomic History   Marital status: Married    Spouse name: Not on file   Number of children: Not on file   Years of education: Not on file   Highest education level: Not on file  Occupational History   Not on file  Tobacco Use   Smoking status: Never   Smokeless tobacco: Never  Vaping Use   Vaping Use: Never used  Substance and Sexual Activity   Alcohol use: Yes    Comment: OCCASIONALLY   Drug use: No   Sexual activity: Yes    Partners: Female  Other Topics Concern   Not on file  Social History Narrative   Not on file   Social Determinants of Health   Financial Resource Strain: Not on file  Food Insecurity: Not on file  Transportation Needs: Not on file  Physical Activity: Not on file  Stress: Not on file  Social Connections: Not on file  Intimate Partner Violence: Not on file      Review of Systems  Constitutional:  Negative for chills, fatigue and unexpected weight change.  HENT:  Negative for congestion, postnasal drip, rhinorrhea,  sneezing and sore throat.   Eyes:  Negative for redness.  Respiratory:  Negative for cough, chest tightness and shortness of breath.   Cardiovascular:  Negative for chest pain and palpitations.  Gastrointestinal:  Negative for abdominal pain, constipation, diarrhea, nausea and vomiting.  Genitourinary:  Negative for dysuria and frequency.  Musculoskeletal:  Negative for arthralgias, back pain, joint swelling and neck pain.  Skin:  Negative for rash.  Neurological:  Negative for tremors and numbness.  Hematological:  Negative for adenopathy. Does not bruise/bleed easily.  Psychiatric/Behavioral:  Negative for behavioral problems (Depression),  sleep disturbance and suicidal ideas. The patient is not nervous/anxious.    Vital Signs: BP 140/90 Comment: 146/94   Pulse 78    Temp 98.4 F (36.9 C)    Resp 16    Ht 6\' 1"  (1.854 m)    Wt 284 lb (128.8 kg)    SpO2 97%    BMI 37.47 kg/m    Physical Exam Vitals and nursing note reviewed.  Constitutional:      General: He is not in acute distress.    Appearance: He is well-developed. He is obese. He is not diaphoretic.  HENT:     Head: Normocephalic and atraumatic.     Mouth/Throat:     Pharynx: No oropharyngeal exudate.  Eyes:     Pupils: Pupils are equal, round, and reactive to light.  Neck:     Thyroid: No thyromegaly.     Vascular: No JVD.     Trachea: No tracheal deviation.  Cardiovascular:     Rate and Rhythm: Normal rate and regular rhythm.     Heart sounds: Normal heart sounds. No murmur heard.   No friction rub. No gallop.  Pulmonary:     Effort: Pulmonary effort is normal. No respiratory distress.     Breath sounds: No wheezing or rales.  Chest:     Chest wall: No tenderness.  Abdominal:     General: Bowel sounds are normal.     Palpations: Abdomen is soft.  Musculoskeletal:        General: Normal range of motion.     Cervical back: Normal range of motion and neck supple.  Lymphadenopathy:     Cervical: No cervical adenopathy.  Skin:    General: Skin is warm and dry.  Neurological:     Mental Status: He is alert and oriented to person, place, and time.     Cranial Nerves: No cranial nerve deficit.  Psychiatric:        Behavior: Behavior normal.        Thought Content: Thought content normal.        Judgment: Judgment normal.       Assessment/Plan: 1. Essential hypertension Will continue lisinopril as before and monitor closely. Borderline and may need to increase medication if rising.  2. Obesity (BMI 30-39.9) Will continue to work on diet and exercise and will start on topamax - topiramate (TOPAMAX) 25 MG tablet; Take 1 tablet (25 mg total) by  mouth 2 (two) times daily.  Dispense: 30 tablet; Refill: 1  3. Hyperlipidemia LDL goal <100 Will continue to work on diet and will start low dose crestor nightly - rosuvastatin (CRESTOR) 5 MG tablet; Take 1 tablet (5 mg total) by mouth daily.  Dispense: 90 tablet; Refill: 3   General Counseling: Toma verbalizes understanding of the findings of todays visit and agrees with plan of treatment. I have discussed any further diagnostic evaluation that may be needed or ordered  today. We also reviewed his medications today. he has been encouraged to call the office with any questions or concerns that should arise related to todays visit.    No orders of the defined types were placed in this encounter.   Meds ordered this encounter  Medications   topiramate (TOPAMAX) 25 MG tablet    Sig: Take 1 tablet (25 mg total) by mouth 2 (two) times daily.    Dispense:  30 tablet    Refill:  1   rosuvastatin (CRESTOR) 5 MG tablet    Sig: Take 1 tablet (5 mg total) by mouth daily.    Dispense:  90 tablet    Refill:  3    This patient was seen by Lynn Ito, PA-C in collaboration with Dr. Beverely Risen as a part of collaborative care agreement.   Total time spent:30 Minutes Time spent includes review of chart, medications, test results, and follow up plan with the patient.      Dr Lyndon Code Internal medicine

## 2021-09-23 ENCOUNTER — Ambulatory Visit: Payer: BC Managed Care – PPO | Admitting: Internal Medicine

## 2021-09-23 ENCOUNTER — Other Ambulatory Visit: Payer: Self-pay

## 2021-09-23 ENCOUNTER — Encounter: Payer: Self-pay | Admitting: Internal Medicine

## 2021-09-23 VITALS — BP 108/90 | HR 89 | Temp 98.3°F | Ht 73.0 in | Wt 277.2 lb

## 2021-09-23 DIAGNOSIS — R0602 Shortness of breath: Secondary | ICD-10-CM

## 2021-09-23 DIAGNOSIS — G4733 Obstructive sleep apnea (adult) (pediatric): Secondary | ICD-10-CM

## 2021-09-23 DIAGNOSIS — J4520 Mild intermittent asthma, uncomplicated: Secondary | ICD-10-CM

## 2021-09-23 NOTE — Progress Notes (Signed)
[facility] ?[address] ?[place], [place] [address] ? ?Pulmonary Sleep Medicine  ? ?Office Visit Note ? ?Patient Name: Luis Hendrix. ?DOB: 10/17/1965 ?MRN [id number] ? ?Date of Service: 09/23/2021 ? ?Complaints/HPI: Asthma. Patient states he uses the rescue maybe twice per week. Other than that he does well. He has no recollection of attack as such. No admission to the hospital for asthm and no ED visits. No cough other than a sniffle now and then. No wheeze noted denies having chest pain also ? ?ROS ? ?General: (-) fever, (-) chills, (-) night sweats, (-) weakness ?Skin: (-) rashes, (-) itching,. ?Eyes: (-) visual changes, (-) redness, (-) itching. ?Nose and Sinuses: (-) nasal stuffiness or itchiness, (-) postnasal drip, (-) nosebleeds, (-) sinus trouble. ?Mouth and Throat: (-) sore throat, (-) hoarseness. ?Neck: (-) swollen glands, (-) enlarged thyroid, (-) neck pain. ?Respiratory: - cough, (-) bloody sputum, - shortness of breath, - wheezing. ?Cardiovascular: - ankle swelling, (-) chest pain. ?Lymphatic: (-) lymph node enlargement. ?Neurologic: (-) numbness, (-) tingling. ?Psychiatric: (-) anxiety, (-) depression ? ? ?Current Medication: ?Outpatient Encounter Medications as of 09/23/2021  ?Medication Sig  ? albuterol (PROAIR HFA) 108 (90 Base) MCG/ACT inhaler ProAir HFA 90 mcg/actuation aerosol inhaler  ? budesonide-formoterol (SYMBICORT) 160-4.5 MCG/ACT inhaler Inhale 2 puffs into the lungs 2 (two) times daily.  ? lisinopril (ZESTRIL) 20 MG tablet TAKE ONE TABLET BY MOUTH DAILY  ? rosuvastatin (CRESTOR) 5 MG tablet Take 1 tablet (5 mg total) by mouth daily.  ? SYMBICORT 160-4.5 MCG/ACT inhaler Inhale 1 puff into the lungs 2 (two) times daily as needed.  ? topiramate (TOPAMAX) 25 MG tablet Take 1 tablet (25 mg total) by mouth 2 (two) times daily.  ? ?No facility-administered encounter medications on file as of 09/23/2021.  ? ? ?Surgical History: ?Past Surgical History:  ?Procedure Laterality  Date  ? COLONOSCOPY WITH PROPOFOL N/A 02/26/2020  ? Procedure: COLONOSCOPY WITH PROPOFOL;  Surgeon: Toney Reil, MD;  Location: [facility] ENDOSCOPY;  Service: Gastroenterology;  Laterality: N/A;  ? DIAGNOSTIC LAPAROSCOPY    ? ELBOW FRACTURE SURGERY Right   ? Approx age 89  ? FRACTURE SURGERY    ? LAPAROSCOPIC GASTRIC SLEEVE RESECTION    ? ? ?Medical History: ?Past Medical History:  ?Diagnosis Date  ? Asthma   ? Hypertension   ? Sleep apnea   ? ? ?Family History: ?Family History  ?Problem Relation Age of Onset  ? Hypertension Father   ? ? ?Social History: ?Social History  ? ?Socioeconomic History  ? Marital status: Married  ?  Spouse name: Not on file  ? Number of children: Not on file  ? Years of education: Not on file  ? Highest education level: Not on file  ?Occupational History  ? Not on file  ?Tobacco Use  ? Smoking status: Never  ? Smokeless tobacco: Never  ?Vaping Use  ? Vaping Use: Never used  ?Substance and Sexual Activity  ? Alcohol use: Yes  ?  Comment: OCCASIONALLY  ? Drug use: No  ? Sexual activity: Yes  ?  Partners: Female  ?Other Topics Concern  ? Not on file  ?Social History Narrative  ? Not on file  ? ?Social Determinants of Health  ? ?Financial Resource Strain: Not on file  ?Food Insecurity: Not on file  ?Transportation Needs: Not on file  ?Physical Activity: Not on file  ?Stress: Not on file  ?Social Connections: Not on file  ?Intimate Partner Violence: Not on file  ? ? ?  Vital Signs: ?Blood pressure 108/90, pulse 89, temperature 98.3 ?F (36.8 ?C), height 6\' 1"  (1.854 m), weight 277 lb 3.2 oz (125.7 kg), SpO2 98 %. ? ?Examination: ?General Appearance: The patient is well-developed, well-nourished, and in no distress. ?Skin: Gross inspection of skin unremarkable. ?Head: normocephalic, no gross deformities. ?Eyes: no gross deformities noted. ?ENT: ears appear grossly normal no exudates. ?Neck: Supple. No thyromegaly. No LAD. ?Respiratory: no rhonchi noted. ?Cardiovascular: Normal S1 and S2 without  murmur or rub. ?Extremities: No cyanosis. pulses are equal. ?Neurologic: Alert and oriented. No involuntary movements. ? ?LABS: ?No results found for this or any previous visit (from the past 2160 hour(s)). ? ?Radiology: ?No results found. ? ?No results found. ? ?No results found. ? ? ? ?Assessment and Plan: ?Patient Active Problem List  ? Diagnosis Date Noted  ? Moderate persistent asthma without complication 07/26/2020  ? BMI 35.0-35.9,adult 07/26/2020  ? Encounter for routine adult health examination with abnormal findings 05/24/2020  ? Dysuria 05/24/2020  ? Encounter for screening colonoscopy 02/04/2020  ? Hyperlipidemia LDL goal <100 04/23/2019  ? Moderate asthma without complication 12/15/2017  ? Essential hypertension 02/10/2016  ? Heartburn 02/10/2016  ? Moderate obesity 02/10/2016  ? Obstructive sleep apnea syndrome 02/10/2016  ? ?1. SOB (shortness of breath) ?Doing well no major symptoms noted ?- Spirometry with Graph ? ?2. Chronic asthma, mild intermittent, uncomplicated ?On symbicort and albuterol as needed. Has been well controlled ? ?3. Obesity, morbid (HCC) ?Obesity Counseling: Had a lengthy discussion regarding patients BMI and weight issues. Patient was instructed on portion control as well as increased activity. Also discussed caloric restrictions with trying to maintain intake less than 2000 Kcal. Discussions were made in accordance with the 5As of weight management. Simple actions such as not eating late and if able to, taking a walk is suggested. ? ? ?4. Obstructive sleep apnea syndrome ?Has been tested multiple times not on CPAP has improvement with weight loss  ? ?General Counseling: I have discussed the findings of the evaluation and examination with 02/12/2016.  I have also discussed any further diagnostic evaluation thatmay be needed or ordered today. Presten verbalizes understanding of the findings of todays visit. We also reviewed his medications today and discussed drug interactions and side  effects including but not limited excessive drowsiness and altered mental states. We also discussed that there is always a risk not just to him but also people around him. he has been encouraged to call the office with any questions or concerns that should arise related to todays visit. ? ?Orders Placed This Encounter  ?Procedures  ? Spirometry with Graph  ?  Order Specific Question:   Where should this test be performed?  ?  Answer:   [facility]  ?  Order Specific Question:   Basic spirometry  ?  Answer:   Yes  ?  Order Specific Question:   Spirometry pre & post bronchodilator  ?  Answer:   No  ?  ? ?Time spent: 45 ? ?I have personally obtained a history, examined the patient, evaluated laboratory and imaging results, formulated the assessment and plan and placed orders. ? ?  ?[facility], MD FCCP ?Pulmonary and Critical Care ?Sleep medicine ?

## 2021-09-23 NOTE — Patient Instructions (Signed)
Asthma, Adult ?Asthma is a long-term (chronic) condition in which the airways get tight and narrow. The airways are the breathing passages that lead from the nose and mouth down into the lungs. A person with asthma will have times when symptoms get worse. These are called asthma attacks. They can cause coughing, whistling sounds when you breathe (wheezing), shortness of breath, and chest pain. They can make it hard to breathe. There is no cure for asthma, but medicines and lifestyle changes can help control it. ?There are many things that can bring on an asthma attack or make asthma symptoms worse (triggers). Common triggers include: ?Mold. ?Dust. ?Cigarette smoke. ?Cockroaches. ?Things that can cause allergy symptoms (allergens). These include animal skin flakes (dander) and pollen from trees or grass. ?Things that pollute the air. These may include household cleaners, wood smoke, smog, or chemical odors. ?Cold air, weather changes, and wind. ?Crying or laughing hard. ?Stress. ?Certain medicines or drugs. ?Certain foods such as dried fruit, potato chips, and grape juice. ?Infections, such as a cold or the flu. ?Certain medical conditions or diseases. ?Exercise or tiring activities. ?Asthma may be treated with medicines and by staying away from the things that cause asthma attacks. Types of medicines may include: ?Controller medicines. These help prevent asthma symptoms. They are usually taken every day. ?Fast-acting reliever or rescue medicines. These quickly relieve asthma symptoms. They are used as needed and provide short-term relief. ?Allergy medicines if your attacks are brought on by allergens. ?Medicines to help control the body's defense (immune) system. ?Follow these instructions at home: ?Avoiding triggers in your home ?Change your heating and air conditioning filter often. ?Limit your use of fireplaces and wood stoves. ?Get rid of pests (such as roaches and mice) and their droppings. ?Throw away plants  if you see mold on them. ?Clean your floors. Dust regularly. Use cleaning products that do not smell. ?Have someone vacuum when you are not home. Use a vacuum cleaner with a HEPA filter if possible. ?Replace carpet with wood, tile, or vinyl flooring. Carpet can trap animal skin flakes and dust. ?Use allergy-proof pillows, mattress covers, and box spring covers. ?Wash bed sheets and blankets every week in hot water. Dry them in a dryer. ?Keep your bedroom free of any triggers. ?Avoid pets and keep windows closed when things that cause allergy symptoms are in the air. ?Use blankets that are made of polyester or cotton. ?Clean bathrooms and kitchens with bleach. If possible, have someone repaint the walls in these rooms with mold-resistant paint. Keep out of the rooms that are being cleaned and painted. ?Wash your hands often with soap and water. If soap and water are not available, use hand sanitizer. ?Do not allow anyone to smoke in your home. ?General instructions ?Take over-the-counter and prescription medicines only as told by your doctor. ?Talk with your doctor if you have questions about how or when to take your medicines. ?Make note if you need to use your medicines more often than usual. ?Do not use any products that contain nicotine or tobacco, such as cigarettes and e-cigarettes. If you need help quitting, ask your doctor. ?Stay away from secondhand smoke. ?Avoid doing things outdoors when allergen counts are high and when air quality is low. ?Wear a ski mask when doing outdoor activities in the winter. The mask should cover your nose and mouth. Exercise indoors on cold days if you can. ?Warm up before you exercise. Take time to cool down after exercise. ?Use a peak flow meter as   told by your doctor. A peak flow meter is a tool that measures how well the lungs are working. ?Keep track of the peak flow meter's readings. Write them down. ?Follow your asthma action plan. This is a written plan for taking care  of your asthma and treating your attacks. ?Make sure you get all the shots (vaccines) that your doctor recommends. Ask your doctor about a flu shot and a pneumonia shot. ?Keep all follow-up visits as told by your doctor. This is important. ?Contact a doctor if: ?You have wheezing, shortness of breath, or a cough even while taking medicine to prevent attacks. ?The mucus you cough up (sputum) is thicker than usual. ?The mucus you cough up changes from clear or white to yellow, green, gray, or bloody. ?You have problems from the medicine you are taking, such as: ?A rash. ?Itching. ?Swelling. ?Trouble breathing. ?You need reliever medicines more than 2-3 times a week. ?Your peak flow reading is still at 50-79% of your personal best after following the action plan for 1 hour. ?You have a fever. ?Get help right away if: ?You seem to be worse and are not responding to medicine during an asthma attack. ?You are short of breath even at rest. ?You get short of breath when doing very little activity. ?You have trouble eating, drinking, or talking. ?You have chest pain or tightness. ?You have a fast heartbeat. ?Your lips or fingernails start to turn blue. ?You are light-headed or dizzy, or you faint. ?Your peak flow is less than 50% of your personal best. ?You feel too tired to breathe normally. ?Summary ?Asthma is a long-term (chronic) condition in which the airways get tight and narrow. An asthma attack can make it hard to breathe. ?Asthma cannot be cured, but medicines and lifestyle changes can help control it. ?Make sure you understand how to avoid triggers and how and when to use your medicines. ?This information is not intended to replace advice given to you by your health care provider. Make sure you discuss any questions you have with your health care provider. ?Document Revised: 10/27/2019 Document Reviewed: 11/06/2019 ?Elsevier Patient Education ? 2022 Elsevier Inc. ? ?

## 2021-10-07 ENCOUNTER — Encounter: Payer: Self-pay | Admitting: Physician Assistant

## 2021-10-07 ENCOUNTER — Ambulatory Visit (INDEPENDENT_AMBULATORY_CARE_PROVIDER_SITE_OTHER): Payer: BC Managed Care – PPO | Admitting: Physician Assistant

## 2021-10-07 ENCOUNTER — Other Ambulatory Visit: Payer: Self-pay

## 2021-10-07 DIAGNOSIS — I10 Essential (primary) hypertension: Secondary | ICD-10-CM | POA: Diagnosis not present

## 2021-10-07 DIAGNOSIS — E669 Obesity, unspecified: Secondary | ICD-10-CM

## 2021-10-07 DIAGNOSIS — J4520 Mild intermittent asthma, uncomplicated: Secondary | ICD-10-CM | POA: Diagnosis not present

## 2021-10-07 NOTE — Progress Notes (Signed)
[facility] ?[address] ?[place], [place] [address] ? ?Internal MEDICINE  ?Office Visit Note ? ?Patient Name: Luis Hendrix. ? [id number]  ?[id number] ? ?Date of Service: 10/07/2021 ? ?Chief Complaint  ?Patient presents with  ? Follow-up  ? Hypertension  ? Asthma  ? ? ?HPI ?Pt is here for routine follow up for weight management ?-He has lost 5lbs since last visit on topamax and is tolerating the medication well ?-he is working on reducing late night snacking ?-Breathing has been stable, had a visit with Dr. Welton Flakes a few weeks ago for pulmonary follow up  ?-Does a lot of mowing which will continue to pick up and will be increasing his activity/exercise level ? ?Current Medication: ?Outpatient Encounter Medications as of 10/07/2021  ?Medication Sig  ? albuterol (PROAIR HFA) 108 (90 Base) MCG/ACT inhaler ProAir HFA 90 mcg/actuation aerosol inhaler  ? budesonide-formoterol (SYMBICORT) 160-4.5 MCG/ACT inhaler Inhale 2 puffs into the lungs 2 (two) times daily.  ? lisinopril (ZESTRIL) 20 MG tablet TAKE ONE TABLET BY MOUTH DAILY  ? rosuvastatin (CRESTOR) 5 MG tablet Take 1 tablet (5 mg total) by mouth daily.  ? SYMBICORT 160-4.5 MCG/ACT inhaler Inhale 1 puff into the lungs 2 (two) times daily as needed.  ? topiramate (TOPAMAX) 25 MG tablet Take 1 tablet (25 mg total) by mouth 2 (two) times daily.  ? ?No facility-administered encounter medications on file as of 10/07/2021.  ? ? ?Surgical History: ?Past Surgical History:  ?Procedure Laterality Date  ? COLONOSCOPY WITH PROPOFOL N/A 02/26/2020  ? Procedure: COLONOSCOPY WITH PROPOFOL;  Surgeon: Toney Reil, MD;  Location: [facility] ENDOSCOPY;  Service: Gastroenterology;  Laterality: N/A;  ? DIAGNOSTIC LAPAROSCOPY    ? ELBOW FRACTURE SURGERY Right   ? Approx age 63  ? FRACTURE SURGERY    ? LAPAROSCOPIC GASTRIC SLEEVE RESECTION    ? ? ?Medical History: ?Past Medical History:  ?Diagnosis Date  ? Asthma   ? Hypertension   ? Sleep apnea   ? ? ?Family History: ?Family  History  ?Problem Relation Age of Onset  ? Hypertension Father   ? ? ?Social History  ? ?Socioeconomic History  ? Marital status: Married  ?  Spouse name: Not on file  ? Number of children: Not on file  ? Years of education: Not on file  ? Highest education level: Not on file  ?Occupational History  ? Not on file  ?Tobacco Use  ? Smoking status: Never  ? Smokeless tobacco: Never  ?Vaping Use  ? Vaping Use: Never used  ?Substance and Sexual Activity  ? Alcohol use: Yes  ?  Comment: OCCASIONALLY  ? Drug use: No  ? Sexual activity: Yes  ?  Partners: Female  ?Other Topics Concern  ? Not on file  ?Social History Narrative  ? Not on file  ? ?Social Determinants of Health  ? ?Financial Resource Strain: Not on file  ?Food Insecurity: Not on file  ?Transportation Needs: Not on file  ?Physical Activity: Not on file  ?Stress: Not on file  ?Social Connections: Not on file  ?Intimate Partner Violence: Not on file  ? ? ? ? ?Review of Systems  ?Constitutional:  Negative for chills, fatigue and unexpected weight change.  ?HENT:  Negative for congestion, postnasal drip, rhinorrhea, sneezing and sore throat.   ?Eyes:  Negative for redness.  ?Respiratory:  Negative for cough, chest tightness and shortness of breath.   ?Cardiovascular:  Negative for chest pain and palpitations.  ?Gastrointestinal:  Negative  for abdominal pain, constipation, diarrhea, nausea and vomiting.  ?Genitourinary:  Negative for dysuria and frequency.  ?Musculoskeletal:  Negative for arthralgias, back pain, joint swelling and neck pain.  ?Skin:  Negative for rash.  ?Neurological:  Negative for tremors and numbness.  ?Hematological:  Negative for adenopathy. Does not bruise/bleed easily.  ?Psychiatric/Behavioral:  Negative for behavioral problems (Depression), sleep disturbance and suicidal ideas. The patient is not nervous/anxious.   ? ?Vital Signs: ?BP 135/85   Pulse 95   Temp (!) 97.5 ?F (36.4 ?C)   Resp 16   Ht 6\' 1"  (1.854 m)   Wt 279 lb 12.8 oz (126.9  kg)   SpO2 99%   BMI 36.92 kg/m?  ? ? ?Physical Exam ?Vitals and nursing note reviewed.  ?Constitutional:   ?   General: He is not in acute distress. ?   Appearance: He is well-developed. He is obese. He is not diaphoretic.  ?HENT:  ?   Head: Normocephalic and atraumatic.  ?   Mouth/Throat:  ?   Pharynx: No oropharyngeal exudate.  ?Eyes:  ?   Pupils: Pupils are equal, round, and reactive to light.  ?Neck:  ?   Thyroid: No thyromegaly.  ?   Vascular: No JVD.  ?   Trachea: No tracheal deviation.  ?Cardiovascular:  ?   Rate and Rhythm: Normal rate and regular rhythm.  ?   Heart sounds: Normal heart sounds. No murmur heard. ?  No friction rub. No gallop.  ?Pulmonary:  ?   Effort: Pulmonary effort is normal. No respiratory distress.  ?   Breath sounds: No wheezing or rales.  ?Chest:  ?   Chest wall: No tenderness.  ?Abdominal:  ?   General: Bowel sounds are normal.  ?   Palpations: Abdomen is soft.  ?Musculoskeletal:     ?   General: Normal range of motion.  ?   Cervical back: Normal range of motion and neck supple.  ?Lymphadenopathy:  ?   Cervical: No cervical adenopathy.  ?Skin: ?   General: Skin is warm and dry.  ?Neurological:  ?   Mental Status: He is alert and oriented to person, place, and time.  ?   Cranial Nerves: No cranial nerve deficit.  ?Psychiatric:     ?   Behavior: Behavior normal.     ?   Thought Content: Thought content normal.     ?   Judgment: Judgment normal.  ? ? ? ? ? ?Assessment/Plan: ?1. Essential hypertension ?Stable, continue current medications ? ?2. Chronic asthma, mild intermittent, uncomplicated ?Stable, continue inhalers as before, followed by pulmonology ? ?3. Obesity (BMI 30-39.9) ?Down 5lbs in 1 month since last visit. May continue topamax and will continue working on diet and exercise ?Obesity Counseling: Had a lengthy discussion regarding patients BMI and weight issues. Patient was instructed on portion control as well as increased activity. Also discussed caloric restrictions with  trying to maintain intake less than 2000 Kcal. Discussions were made in accordance with the 5As of weight management. Simple actions such as not eating late and if able to, taking a walk is suggested. ? ? ? ?General Counseling: Armstead PeaksGeorge verbalizes understanding of the findings of todays visit and agrees with plan of treatment. I have discussed any further diagnostic evaluation that may be needed or ordered today. We also reviewed his medications today. he has been encouraged to call the office with any questions or concerns that should arise related to todays visit. ? ? ? ?No orders of the  defined types were placed in this encounter. ? ? ?No orders of the defined types were placed in this encounter. ? ? ?This patient was seen by Lynn Ito, PA-C in collaboration with Dr. Beverely Risen as a part of collaborative care agreement. ? ? ?Total time spent:30 Minutes ?Time spent includes review of chart, medications, test results, and follow up plan with the patient.  ? ? ? ? ?Dr Lyndon Code ?Internal medicine  ?

## 2021-10-11 ENCOUNTER — Other Ambulatory Visit: Payer: Self-pay | Admitting: Internal Medicine

## 2021-10-11 DIAGNOSIS — I10 Essential (primary) hypertension: Secondary | ICD-10-CM

## 2021-10-22 ENCOUNTER — Other Ambulatory Visit: Payer: Self-pay | Admitting: Physician Assistant

## 2021-10-22 DIAGNOSIS — E669 Obesity, unspecified: Secondary | ICD-10-CM

## 2021-10-25 ENCOUNTER — Other Ambulatory Visit: Payer: Self-pay

## 2021-10-25 DIAGNOSIS — E669 Obesity, unspecified: Secondary | ICD-10-CM

## 2021-10-25 MED ORDER — TOPIRAMATE 25 MG PO TABS: 25.0000 mg | ORAL_TABLET | Freq: Two times a day (BID) | ORAL | 3 refills | Status: DC

## 2021-11-15 ENCOUNTER — Other Ambulatory Visit: Payer: Self-pay | Admitting: Nurse Practitioner

## 2021-11-15 ENCOUNTER — Other Ambulatory Visit: Payer: Self-pay

## 2021-11-15 DIAGNOSIS — J4540 Moderate persistent asthma, uncomplicated: Secondary | ICD-10-CM

## 2021-11-15 DIAGNOSIS — J45909 Unspecified asthma, uncomplicated: Secondary | ICD-10-CM

## 2021-11-15 MED ORDER — SYMBICORT 160-4.5 MCG/ACT IN AERO: 1.0000 | INHALATION_SPRAY | Freq: Two times a day (BID) | RESPIRATORY_TRACT | 5 refills | Status: DC | PRN

## 2021-11-15 MED ORDER — ALBUTEROL SULFATE HFA 108 (90 BASE) MCG/ACT IN AERS: INHALATION_SPRAY | RESPIRATORY_TRACT | 5 refills | Status: DC

## 2021-12-09 ENCOUNTER — Encounter: Payer: Self-pay | Admitting: Physician Assistant

## 2021-12-09 ENCOUNTER — Ambulatory Visit (INDEPENDENT_AMBULATORY_CARE_PROVIDER_SITE_OTHER): Payer: BC Managed Care – PPO | Admitting: Physician Assistant

## 2021-12-09 VITALS — BP 140/88 | HR 82 | Temp 97.6°F | Resp 16 | Ht 73.0 in | Wt 275.4 lb

## 2021-12-09 DIAGNOSIS — E669 Obesity, unspecified: Secondary | ICD-10-CM | POA: Diagnosis not present

## 2021-12-09 DIAGNOSIS — E785 Hyperlipidemia, unspecified: Secondary | ICD-10-CM | POA: Diagnosis not present

## 2021-12-09 DIAGNOSIS — I10 Essential (primary) hypertension: Secondary | ICD-10-CM

## 2021-12-09 NOTE — Progress Notes (Signed)
[facility] [address] [place], [place] [address]  Internal MEDICINE  Office Visit Note  Patient Name: Luis Hendrix  [id number]  [id number]  Date of Service: 12/09/2021  Chief Complaint  Patient presents with   Follow-up   Hypertension    HPI Pt is here for routine follow up and has no complaints today -Exercising 4-5 times per week and staying very active -Avoiding late night snacks -Taking topamax once per day and doing well with this -Has lost another 4lbs since last visit -BP at home not checked often, but does have a cuff and can start checking again  Current Medication: Outpatient Encounter Medications as of 12/09/2021  Medication Sig   albuterol (PROAIR HFA) 108 (90 Base) MCG/ACT inhaler ProAir HFA 90 mcg/actuation aerosol inhaler   budesonide-formoterol (SYMBICORT) 160-4.5 MCG/ACT inhaler Inhale 2 puffs into the lungs 2 (two) times daily.   lisinopril (ZESTRIL) 20 MG tablet TAKE ONE TABLET BY MOUTH DAILY   rosuvastatin (CRESTOR) 5 MG tablet Take 1 tablet (5 mg total) by mouth daily.   SYMBICORT 160-4.5 MCG/ACT inhaler Inhale 1 puff into the lungs 2 (two) times daily as needed.   topiramate (TOPAMAX) 25 MG tablet Take 1 tablet (25 mg total) by mouth 2 (two) times daily.   No facility-administered encounter medications on file as of 12/09/2021.    Surgical History: Past Surgical History:  Procedure Laterality Date   COLONOSCOPY WITH PROPOFOL N/A 02/26/2020   Procedure: COLONOSCOPY WITH PROPOFOL;  Surgeon: Toney Reil, MD;  Location: [facility] ENDOSCOPY;  Service: Gastroenterology;  Laterality: N/A;   DIAGNOSTIC LAPAROSCOPY     ELBOW FRACTURE SURGERY Right    Approx age 43   FRACTURE SURGERY     LAPAROSCOPIC GASTRIC SLEEVE RESECTION      Medical History: Past Medical History:  Diagnosis Date   Asthma    Hypertension    Sleep apnea     Family History: Family History  Problem Relation Age of Onset   Hypertension Father     Social  History   Socioeconomic History   Marital status: Married    Spouse name: Not on file   Number of children: Not on file   Years of education: Not on file   Highest education level: Not on file  Occupational History   Not on file  Tobacco Use   Smoking status: Never   Smokeless tobacco: Never  Vaping Use   Vaping Use: Never used  Substance and Sexual Activity   Alcohol use: Yes    Comment: OCCASIONALLY   Drug use: No   Sexual activity: Yes    Partners: Female  Other Topics Concern   Not on file  Social History Narrative   Not on file   Social Determinants of Health   Financial Resource Strain: Not on file  Food Insecurity: Not on file  Transportation Needs: Not on file  Physical Activity: Not on file  Stress: Not on file  Social Connections: Not on file  Intimate Partner Violence: Not on file      Review of Systems  Constitutional:  Negative for chills, fatigue and unexpected weight change.  HENT:  Negative for congestion, postnasal drip, rhinorrhea, sneezing and sore throat.   Eyes:  Negative for redness.  Respiratory:  Negative for cough, chest tightness and shortness of breath.   Cardiovascular:  Negative for chest pain and palpitations.  Gastrointestinal:  Negative for abdominal pain, constipation, diarrhea, nausea and vomiting.  Genitourinary:  Negative for dysuria and  frequency.  Musculoskeletal:  Negative for arthralgias, back pain, joint swelling and neck pain.  Skin:  Negative for rash.  Neurological:  Negative for tremors and numbness.  Hematological:  Negative for adenopathy. Does not bruise/bleed easily.  Psychiatric/Behavioral:  Negative for behavioral problems (Depression), sleep disturbance and suicidal ideas. The patient is not nervous/anxious.    Vital Signs: BP 140/88 Comment: 145/90  Pulse 82   Temp 97.6 F (36.4 C)   Resp 16   Ht 6\' 1"  (1.854 m)   Wt 275 lb 6.4 oz (124.9 kg)   SpO2 95%   BMI 36.33 kg/m    Physical Exam Vitals and  nursing note reviewed.  Constitutional:      General: He is not in acute distress.    Appearance: He is well-developed. He is obese. He is not diaphoretic.  HENT:     Head: Normocephalic and atraumatic.     Mouth/Throat:     Pharynx: No oropharyngeal exudate.  Eyes:     Pupils: Pupils are equal, round, and reactive to light.  Neck:     Thyroid: No thyromegaly.     Vascular: No JVD.     Trachea: No tracheal deviation.  Cardiovascular:     Rate and Rhythm: Normal rate and regular rhythm.     Heart sounds: Normal heart sounds. No murmur heard.   No friction rub. No gallop.  Pulmonary:     Effort: Pulmonary effort is normal. No respiratory distress.     Breath sounds: No wheezing or rales.  Chest:     Chest wall: No tenderness.  Abdominal:     General: Bowel sounds are normal.     Palpations: Abdomen is soft.  Musculoskeletal:        General: Normal range of motion.     Cervical back: Normal range of motion and neck supple.  Lymphadenopathy:     Cervical: No cervical adenopathy.  Skin:    General: Skin is warm and dry.  Neurological:     Mental Status: He is alert and oriented to person, place, and time.     Cranial Nerves: No cranial nerve deficit.  Psychiatric:        Behavior: Behavior normal.        Thought Content: Thought content normal.        Judgment: Judgment normal.       Assessment/Plan: 1. Essential hypertension Stable, continue current medications and home monitoring  2. Hyperlipidemia LDL goal <100 Continue crestor and working on diet and exercise  3. Obesity (BMI 30-39.9) Pt has lost another 4lbs since last visit. Will continue to work on diet and exercise and may continue topamax as before Obesity Counseling: Had a lengthy discussion regarding patients BMI and weight issues. Patient was instructed on portion control as well as increased activity. Also discussed caloric restrictions with trying to maintain intake less than 2000 Kcal. Discussions were  made in accordance with the 5As of weight management. Simple actions such as not eating late and if able to, taking a walk is suggested.    General Counseling: alexandre faries understanding of the findings of todays visit and agrees with plan of treatment. I have discussed any further diagnostic evaluation that may be needed or ordered today. We also reviewed his medications today. he has been encouraged to call the office with any questions or concerns that should arise related to todays visit.    No orders of the defined types were placed in this encounter.   No  orders of the defined types were placed in this encounter.   This patient was seen by Drema Dallas, PA-C in collaboration with Dr. Clayborn Bigness as a part of collaborative care agreement.   Total time spent:30 Minutes Time spent includes review of chart, medications, test results, and follow up plan with the patient.      Dr Lavera Guise Internal medicine

## 2022-01-17 ENCOUNTER — Encounter: Payer: Self-pay | Admitting: Physician Assistant

## 2022-01-17 ENCOUNTER — Ambulatory Visit (INDEPENDENT_AMBULATORY_CARE_PROVIDER_SITE_OTHER): Payer: BC Managed Care – PPO | Admitting: Physician Assistant

## 2022-01-17 VITALS — BP 130/80 | HR 79 | Temp 97.8°F | Resp 16 | Ht 73.0 in | Wt 263.0 lb

## 2022-01-17 DIAGNOSIS — E785 Hyperlipidemia, unspecified: Secondary | ICD-10-CM

## 2022-01-17 DIAGNOSIS — R0989 Other specified symptoms and signs involving the circulatory and respiratory systems: Secondary | ICD-10-CM

## 2022-01-17 DIAGNOSIS — R7989 Other specified abnormal findings of blood chemistry: Secondary | ICD-10-CM | POA: Diagnosis not present

## 2022-01-17 DIAGNOSIS — I10 Essential (primary) hypertension: Secondary | ICD-10-CM

## 2022-01-17 DIAGNOSIS — E669 Obesity, unspecified: Secondary | ICD-10-CM

## 2022-01-17 MED ORDER — COLCHICINE 0.6 MG PO TABS: ORAL_TABLET | ORAL | 0 refills | Status: DC

## 2022-01-17 NOTE — Progress Notes (Signed)
[facility] [address] [place], [place] [address]  Internal MEDICINE  Office Visit Note  Patient Name: Luis Hendrix  [id number]  [id number]  Date of Service: 01/17/2022  Chief Complaint  Patient presents with   Follow-up    Side effect Crestor     Asthma   Hypertension    HPI Pt is here for routine follow up -Down another 12lbs since last visit and continues to take topamax and exercises regularly -Some joint pain with a little cramping for the past few months and thought maybe it was due to exercise routine but still lept bothering him and thinks it is actually the cholesterol med. Stopped taking it about a month ago and joint pain is feeling better. -will retry doing crestor 2 days per week after another week off and see if he tolerates better -BP stable -did have gout many years ago and did wonder if ankle/joint pain could have been due to this but he took his colchicine and didn't make a difference but stopping the crestor did. He would like a refill of colchicine to have on hand in case he does have a flare in future  Current Medication: Outpatient Encounter Medications as of 01/17/2022  Medication Sig   albuterol (PROAIR HFA) 108 (90 Base) MCG/ACT inhaler ProAir HFA 90 mcg/actuation aerosol inhaler   budesonide-formoterol (SYMBICORT) 160-4.5 MCG/ACT inhaler Inhale 2 puffs into the lungs 2 (two) times daily.   colchicine 0.6 MG tablet Take 1 tablet by mouth daily as needed for acute gout flare.   lisinopril (ZESTRIL) 20 MG tablet TAKE ONE TABLET BY MOUTH DAILY   SYMBICORT 160-4.5 MCG/ACT inhaler Inhale 1 puff into the lungs 2 (two) times daily as needed.   topiramate (TOPAMAX) 25 MG tablet Take 1 tablet (25 mg total) by mouth 2 (two) times daily.   rosuvastatin (CRESTOR) 5 MG tablet Take 1 tablet (5 mg total) by mouth daily. (Patient not taking: Reported on 01/17/2022)   No facility-administered encounter medications on file as of 01/17/2022.    Surgical  History: Past Surgical History:  Procedure Laterality Date   COLONOSCOPY WITH PROPOFOL N/A 02/26/2020   Procedure: COLONOSCOPY WITH PROPOFOL;  Surgeon: Toney Reil, MD;  Location: [facility] ENDOSCOPY;  Service: Gastroenterology;  Laterality: N/A;   DIAGNOSTIC LAPAROSCOPY     ELBOW FRACTURE SURGERY Right    Approx age 53   FRACTURE SURGERY     LAPAROSCOPIC GASTRIC SLEEVE RESECTION      Medical History: Past Medical History:  Diagnosis Date   Asthma    Hypertension    Sleep apnea     Family History: Family History  Problem Relation Age of Onset   Hypertension Father     Social History   Socioeconomic History   Marital status: Married    Spouse name: Not on file   Number of children: Not on file   Years of education: Not on file   Highest education level: Not on file  Occupational History   Not on file  Tobacco Use   Smoking status: Never   Smokeless tobacco: Never  Vaping Use   Vaping Use: Never used  Substance and Sexual Activity   Alcohol use: Yes    Comment: OCCASIONALLY   Drug use: No   Sexual activity: Yes    Partners: Female  Other Topics Concern   Not on file  Social History Narrative   Not on file   Social Determinants of Health   Financial Resource Strain: Not  on file  Food Insecurity: Not on file  Transportation Needs: Not on file  Physical Activity: Not on file  Stress: Not on file  Social Connections: Not on file  Intimate Partner Violence: Not on file      Review of Systems  Constitutional:  Negative for chills, fatigue and unexpected weight change.  HENT:  Negative for congestion, postnasal drip, rhinorrhea, sneezing and sore throat.   Eyes:  Negative for redness.  Respiratory:  Negative for cough, chest tightness and shortness of breath.   Cardiovascular:  Negative for chest pain and palpitations.  Gastrointestinal:  Negative for abdominal pain, constipation, diarrhea, nausea and vomiting.  Genitourinary:  Negative for dysuria and  frequency.  Musculoskeletal:  Negative for arthralgias, back pain, joint swelling and neck pain.  Skin:  Negative for rash.  Neurological:  Negative for tremors and numbness.  Hematological:  Negative for adenopathy. Does not bruise/bleed easily.  Psychiatric/Behavioral:  Negative for behavioral problems (Depression), sleep disturbance and suicidal ideas. The patient is not nervous/anxious.     Vital Signs: BP 130/80   Pulse 79   Temp 97.8 F (36.6 C)   Resp 16   Ht 6\' 1"  (1.854 m)   Wt 263 lb (119.3 kg)   SpO2 98%   BMI 34.70 kg/m    Physical Exam Vitals and nursing note reviewed.  Constitutional:      General: He is not in acute distress.    Appearance: He is well-developed. He is obese. He is not diaphoretic.  HENT:     Head: Normocephalic and atraumatic.     Mouth/Throat:     Pharynx: No oropharyngeal exudate.  Eyes:     Pupils: Pupils are equal, round, and reactive to light.  Neck:     Thyroid: No thyromegaly.     Vascular: No JVD.     Trachea: No tracheal deviation.  Cardiovascular:     Rate and Rhythm: Normal rate and regular rhythm.     Heart sounds: Normal heart sounds. No murmur heard.    No friction rub. No gallop.  Pulmonary:     Effort: Pulmonary effort is normal. No respiratory distress.     Breath sounds: No wheezing or rales.  Chest:     Chest wall: No tenderness.  Abdominal:     General: Bowel sounds are normal.     Palpations: Abdomen is soft.  Musculoskeletal:        General: Normal range of motion.     Cervical back: Normal range of motion and neck supple.  Lymphadenopathy:     Cervical: No cervical adenopathy.  Skin:    General: Skin is warm and dry.  Neurological:     Mental Status: He is alert and oriented to person, place, and time.     Cranial Nerves: No cranial nerve deficit.  Psychiatric:        Behavior: Behavior normal.        Thought Content: Thought content normal.        Judgment: Judgment normal.         Assessment/Plan: 1. Essential hypertension Stable, continue current medication  2. Hyperlipidemia LDL goal <100 Will recheck labs and carotid . Will hold crestor for another week then restart with 2x per week. If still having S/E then may try alternative - Lipid Panel With LDL/HDL Ratio - US Carotid Duplex Bilateral; Future  3. Bilateral carotid bruits - US Carotid Duplex Bilateral; Future  4. Elevated serum creatinine Will recheck labs - Comprehensive metabolic  panel  5. Obesity (BMI 30.0-34.9) Down another 12 lbs since last visit and applauded on this success. May continue topamax and working on diet and exercise   General Counseling: malvin morrish understanding of the findings of todays visit and agrees with plan of treatment. I have discussed any further diagnostic evaluation that may be needed or ordered today. We also reviewed his medications today. he has been encouraged to call the office with any questions or concerns that should arise related to todays visit.    Orders Placed This Encounter  Procedures   US Carotid Duplex Bilateral   Comprehensive metabolic panel   Lipid Panel With LDL/HDL Ratio    Meds ordered this encounter  Medications   colchicine 0.6 MG tablet    Sig: Take 1 tablet by mouth daily as needed for acute gout flare.    Dispense:  30 tablet    Refill:  0    This patient was seen by Lynn Ito, PA-C in collaboration with Dr. Beverely Risen as a part of collaborative care agreement.   Total time spent:30 Minutes Time spent includes review of chart, medications, test results, and follow up plan with the patient.      Dr Lyndon Code Internal medicine

## 2022-01-26 LAB — COMPREHENSIVE METABOLIC PANEL
ALT: 20 IU/L (ref 0–44)
AST: 20 IU/L (ref 0–40)
Albumin/Globulin Ratio: 1.8 (ref 1.2–2.2)
Albumin: 4.3 g/dL (ref 3.8–4.9)
Alkaline Phosphatase: 62 IU/L (ref 44–121)
BUN/Creatinine Ratio: 11 (ref 9–20)
BUN: 14 mg/dL (ref 6–24)
Bilirubin Total: 0.5 mg/dL (ref 0.0–1.2)
CO2: 21 mmol/L (ref 20–29)
Calcium: 9.7 mg/dL (ref 8.7–10.2)
Chloride: 105 mmol/L (ref 96–106)
Creatinine, Ser: 1.27 mg/dL (ref 0.76–1.27)
Globulin, Total: 2.4 g/dL (ref 1.5–4.5)
Glucose: 111 mg/dL — ABNORMAL HIGH (ref 70–99)
Potassium: 4.1 mmol/L (ref 3.5–5.2)
Sodium: 140 mmol/L (ref 134–144)
Total Protein: 6.7 g/dL (ref 6.0–8.5)
eGFR: 68 mL/min/{1.73_m2} (ref >59)

## 2022-01-26 LAB — LIPID PANEL WITH LDL/HDL RATIO
Cholesterol, Total: 181 mg/dL (ref 100–199)
HDL: 60 mg/dL (ref >39)
LDL Chol Calc (NIH): 109 mg/dL — ABNORMAL HIGH (ref 0–99)
LDL/HDL Ratio: 1.8 ratio (ref 0.0–3.6)
Triglycerides: 61 mg/dL (ref 0–149)
VLDL Cholesterol Cal: 12 mg/dL (ref 5–40)

## 2022-01-31 ENCOUNTER — Ambulatory Visit (INDEPENDENT_AMBULATORY_CARE_PROVIDER_SITE_OTHER): Payer: BC Managed Care – PPO

## 2022-01-31 DIAGNOSIS — R0989 Other specified symptoms and signs involving the circulatory and respiratory systems: Secondary | ICD-10-CM

## 2022-01-31 DIAGNOSIS — E785 Hyperlipidemia, unspecified: Secondary | ICD-10-CM

## 2022-02-08 ENCOUNTER — Encounter: Payer: Self-pay | Admitting: Podiatry

## 2022-02-08 ENCOUNTER — Ambulatory Visit: Payer: BC Managed Care – PPO | Admitting: Podiatry

## 2022-02-08 DIAGNOSIS — L600 Ingrowing nail: Secondary | ICD-10-CM | POA: Diagnosis not present

## 2022-02-08 NOTE — Progress Notes (Signed)
Subjective:  Patient ID: Luis Pierini., male    DOB: [birth year],  MRN: [id number]  Chief Complaint  Patient presents with   Nail Problem    56 y.o. male presents with the above complaint.  Patient presents with right lateral border ingrown.  Patient states pain for touch is progressive gotten worse.  Hurts with ambulation he would like to get it evaluated.  He has not seen anyone else prior to seeing me.  Pain scale 7 out of 10.  He would like to have it removed.   Review of Systems: Negative except as noted in the HPI. Denies N/V/F/Ch.  Past Medical History:  Diagnosis Date   Asthma    Hypertension    Sleep apnea     Current Outpatient Medications:    albuterol (PROAIR HFA) 108 (90 Base) MCG/ACT inhaler, ProAir HFA 90 mcg/actuation aerosol inhaler, Disp: 18 g, Rfl: 5   budesonide-formoterol (SYMBICORT) 160-4.5 MCG/ACT inhaler, Inhale 2 puffs into the lungs 2 (two) times daily., Disp: 1 each, Rfl: 5   colchicine 0.6 MG tablet, Take 1 tablet by mouth daily as needed for acute gout flare., Disp: 30 tablet, Rfl: 0   lisinopril (ZESTRIL) 20 MG tablet, TAKE ONE TABLET BY MOUTH DAILY, Disp: 90 tablet, Rfl: 1   rosuvastatin (CRESTOR) 5 MG tablet, Take 1 tablet (5 mg total) by mouth daily. (Patient not taking: Reported on 01/17/2022), Disp: 90 tablet, Rfl: 3   SYMBICORT 160-4.5 MCG/ACT inhaler, Inhale 1 puff into the lungs 2 (two) times daily as needed., Disp: 1 each, Rfl: 5   topiramate (TOPAMAX) 25 MG tablet, Take 1 tablet (25 mg total) by mouth 2 (two) times daily., Disp: 30 tablet, Rfl: 3  Social History   Tobacco Use  Smoking Status Never  Smokeless Tobacco Never    No Known Allergies Objective:  There were no vitals filed for this visit. There is no height or weight on file to calculate BMI. Constitutional Well developed. Well nourished.  Vascular Dorsalis pedis pulses palpable bilaterally. Posterior tibial pulses palpable bilaterally. Capillary refill normal to all  digits.  No cyanosis or clubbing noted. Pedal hair growth normal.  Neurologic Normal speech. Oriented to person, place, and time. Epicritic sensation to light touch grossly present bilaterally.  Dermatologic Painful ingrowing nail at lateral nail borders of the hallux nail right. No other open wounds. No skin lesions.  Orthopedic: Normal joint ROM without pain or crepitus bilaterally. No visible deformities. No bony tenderness.   Radiographs: None Assessment:   1. Ingrown toenail of right foot    Plan:  Patient was evaluated and treated and all questions answered.  Ingrown Nail, right -Patient elects to proceed with minor surgery to remove ingrown toenail removal today. Consent reviewed and signed by patient. -Ingrown nail excised. See procedure note. -Educated on post-procedure care including soaking. Written instructions provided and reviewed. -Patient to follow up in 2 weeks for nail check.  Procedure: Excision of Ingrown Toenail Location: Right 1st toe lateral nail borders. Anesthesia: Lidocaine 1% plain; 1.5 mL and Marcaine 0.5% plain; 1.5 mL, digital block. Skin Prep: Betadine. Dressing: Silvadene; telfa; dry, sterile, compression dressing. Technique: Following skin prep, the toe was exsanguinated and a tourniquet was secured at the base of the toe. The affected nail border was freed, split with a nail splitter, and excised. Chemical matrixectomy was then performed with phenol and irrigated out with alcohol. The tourniquet was then removed and sterile dressing applied. Disposition: Patient tolerated procedure well. Patient to return in  2 weeks for follow-up.   No follow-ups on file.

## 2022-02-10 ENCOUNTER — Ambulatory Visit: Payer: BC Managed Care – PPO | Admitting: Physician Assistant

## 2022-02-15 ENCOUNTER — Other Ambulatory Visit: Payer: Self-pay | Admitting: Physician Assistant

## 2022-02-15 DIAGNOSIS — E669 Obesity, unspecified: Secondary | ICD-10-CM

## 2022-02-24 DIAGNOSIS — R0989 Other specified symptoms and signs involving the circulatory and respiratory systems: Secondary | ICD-10-CM | POA: Insufficient documentation

## 2022-02-24 NOTE — Procedures (Signed)
[facility] [address] [place], [place] [address]  DATE OF SERVICE: January 31, 2022  CAROTID DOPPLER INTERPRETATION:  Bilateral Carotid Ultrsasound and Color Doppler Examination was performed. The RIGHT CCA shows minimal plaque in the vessel. The LEFT CCA shows mild plaque in the vessel. There was no significant intimal thickening noted in the RIGHT carotid artery. There was mild intimal thickening in the LEFT carotid artery.  The RIGHT CCA shows peak systolic velocity of 92 cm per second. The end diastolic velocity is 23 cm per second on the RIGHT side. The RIGHT ICA shows peak systolic velocity of 76 per second. RIGHT sided ICA end diastolic velocity is 32 cm per second. The RIGHT ECA shows a peak systolic velocity of 86 cm per second. The ICA/CCA ratio is calculated to be 0.8. This suggests less than 50% stenosis. The Vertebral Artery shows antegrade flow.  The LEFT CCA shows peak systolic velocity of 88 cm per second. The end diastolic velocity is 27 cm per second on the LEFT side. The LEFT ICA shows peak systolic velocity of 63 per second. LEFT sided ICA end diastolic velocity is 33 cm per second. The LEFT ECA shows a peak systolic velocity of 82 cm per second. The ICA/CCA ratio is calculated to be 0.7. This suggests less than 50% stenosis. The Vertebral Artery shows antegrade flow.   Impression:    The RIGHT CAROTID shows less than 50% stenosis. The LEFT CAROTID shows less than 50% stenosis.  There is mild plaque formation noted on the LEFT and minimal plaque on the RIGHT  side. Consider a repeat Carotid doppler if clinical situation and symptoms warrant in 6-12 months. Patient should be encouraged to change lifestyles such as smoking cessation, regular exercise and dietary modification. Use of statins in the right clinical setting and ASA is encouraged.  Yevonne Pax, MD [facility] Pulmonary Critical Care Medicine

## 2022-02-28 ENCOUNTER — Other Ambulatory Visit: Payer: BC Managed Care – PPO

## 2022-03-07 ENCOUNTER — Ambulatory Visit: Payer: BC Managed Care – PPO | Admitting: Internal Medicine

## 2022-03-10 ENCOUNTER — Encounter: Payer: Self-pay | Admitting: Physician Assistant

## 2022-03-10 ENCOUNTER — Ambulatory Visit: Payer: BC Managed Care – PPO | Admitting: Physician Assistant

## 2022-03-10 VITALS — BP 130/80 | HR 66 | Temp 97.8°F | Resp 16 | Ht 73.0 in | Wt 260.0 lb

## 2022-03-10 DIAGNOSIS — E785 Hyperlipidemia, unspecified: Secondary | ICD-10-CM

## 2022-03-10 DIAGNOSIS — E669 Obesity, unspecified: Secondary | ICD-10-CM | POA: Diagnosis not present

## 2022-03-10 DIAGNOSIS — I10 Essential (primary) hypertension: Secondary | ICD-10-CM | POA: Diagnosis not present

## 2022-03-10 DIAGNOSIS — E66811 Obesity, class 1: Secondary | ICD-10-CM

## 2022-03-10 NOTE — Progress Notes (Signed)
[facility] [address] [place], [place] [address]  Internal MEDICINE  Office Visit Note  Patient Name: Luis Hendrix  [id number]  [id number]  Date of Service: 03/10/2022  Chief Complaint  Patient presents with   Follow-up   Hypertension    HPI Pt is here for routine follow up to review carotid US -Down another 3lbs since last visit -carotid US showed less than 50% stenosis bilaterally with mild plaque on left and minimal on right. -Has not been taking crestor at all since last visit after he started having some ankle/joint pain. He had previously tolerated the medication and is unsure if the joint pain in ankle was related but has gone away since stopping. He will start back now 2x per week to see if he tolerates this better and if not will try alternative. -His repeat labs showed improvement in cholesterol, but LDL still elevated -His creatinine was slightly elevated last visit and did improve some to normal range but still higher than in the past. Will need to keep monitoring. His glucose was also slightly elevated but is normally not and will monitor.  -BP at home 130s/80s as well -Breathing stable, hasnt needed albuterol -CPE next visit and will order labs at that visit for monitoring  Current Medication: Outpatient Encounter Medications as of 03/10/2022  Medication Sig   albuterol (PROAIR HFA) 108 (90 Base) MCG/ACT inhaler ProAir HFA 90 mcg/actuation aerosol inhaler   budesonide-formoterol (SYMBICORT) 160-4.5 MCG/ACT inhaler Inhale 2 puffs into the lungs 2 (two) times daily.   colchicine 0.6 MG tablet Take 1 tablet by mouth daily as needed for acute gout flare.   lisinopril (ZESTRIL) 20 MG tablet TAKE ONE TABLET BY MOUTH DAILY   rosuvastatin (CRESTOR) 5 MG tablet Take 1 tablet (5 mg total) by mouth daily.   SYMBICORT 160-4.5 MCG/ACT inhaler Inhale 1 puff into the lungs 2 (two) times daily as needed.   topiramate (TOPAMAX) 25 MG tablet TAKE ONE TABLET BY  MOUTH TWICE A DAY   No facility-administered encounter medications on file as of 03/10/2022.    Surgical History: Past Surgical History:  Procedure Laterality Date   COLONOSCOPY WITH PROPOFOL N/A 02/26/2020   Procedure: COLONOSCOPY WITH PROPOFOL;  Surgeon: Toney Reil, MD;  Location: [facility] ENDOSCOPY;  Service: Gastroenterology;  Laterality: N/A;   DIAGNOSTIC LAPAROSCOPY     ELBOW FRACTURE SURGERY Right    Approx age 87   FRACTURE SURGERY     LAPAROSCOPIC GASTRIC SLEEVE RESECTION      Medical History: Past Medical History:  Diagnosis Date   Asthma    Hypertension    Sleep apnea     Family History: Family History  Problem Relation Age of Onset   Hypertension Father     Social History   Socioeconomic History   Marital status: Married    Spouse name: Not on file   Number of children: Not on file   Years of education: Not on file   Highest education level: Not on file  Occupational History   Not on file  Tobacco Use   Smoking status: Never   Smokeless tobacco: Never  Vaping Use   Vaping Use: Never used  Substance and Sexual Activity   Alcohol use: Yes    Comment: OCCASIONALLY   Drug use: No   Sexual activity: Yes    Partners: Female  Other Topics Concern   Not on file  Social History Narrative   Not on file   Social Determinants of  Health   Financial Resource Strain: Not on file  Food Insecurity: Not on file  Transportation Needs: Not on file  Physical Activity: Not on file  Stress: Not on file  Social Connections: Not on file  Intimate Partner Violence: Not on file      Review of Systems  Constitutional:  Negative for chills, fatigue and unexpected weight change.  HENT:  Negative for congestion, postnasal drip, rhinorrhea, sneezing and sore throat.   Eyes:  Negative for redness.  Respiratory:  Negative for cough, chest tightness and shortness of breath.   Cardiovascular:  Negative for chest pain and palpitations.  Gastrointestinal:  Negative  for abdominal pain, constipation, diarrhea, nausea and vomiting.  Genitourinary:  Negative for dysuria and frequency.  Musculoskeletal:  Negative for arthralgias, back pain, joint swelling and neck pain.  Skin:  Negative for rash.  Neurological:  Negative for tremors and numbness.  Hematological:  Negative for adenopathy. Does not bruise/bleed easily.  Psychiatric/Behavioral:  Negative for behavioral problems (Depression), sleep disturbance and suicidal ideas. The patient is not nervous/anxious.     Vital Signs: BP 130/80   Pulse 66   Temp 97.8 F (36.6 C)   Resp 16   Ht 6\' 1"  (1.854 m)   Wt 260 lb (117.9 kg)   SpO2 98%   BMI 34.30 kg/m    Physical Exam Vitals and nursing note reviewed.  Constitutional:      General: He is not in acute distress.    Appearance: Normal appearance. He is well-developed. He is obese. He is not diaphoretic.  HENT:     Head: Normocephalic and atraumatic.     Mouth/Throat:     Pharynx: No oropharyngeal exudate.  Eyes:     Pupils: Pupils are equal, round, and reactive to light.  Neck:     Thyroid: No thyromegaly.     Vascular: No JVD.     Trachea: No tracheal deviation.  Cardiovascular:     Rate and Rhythm: Normal rate and regular rhythm.     Heart sounds: Normal heart sounds. No murmur heard.    No friction rub. No gallop.  Pulmonary:     Effort: Pulmonary effort is normal. No respiratory distress.     Breath sounds: No wheezing or rales.  Chest:     Chest wall: No tenderness.  Abdominal:     General: Bowel sounds are normal.     Palpations: Abdomen is soft.  Musculoskeletal:        General: Normal range of motion.     Cervical back: Normal range of motion and neck supple.  Lymphadenopathy:     Cervical: No cervical adenopathy.  Skin:    General: Skin is warm and dry.  Neurological:     Mental Status: He is alert and oriented to person, place, and time.     Cranial Nerves: No cranial nerve deficit.  Psychiatric:        Behavior:  Behavior normal.        Thought Content: Thought content normal.        Judgment: Judgment normal.        Assessment/Plan: 1. Essential hypertension Stable, continue current medications  2. Hyperlipidemia LDL goal <100 Improving and only mild plaque on carotid . Will restart on crestor 2x per week and if S/E then consider alternative. Will continue to monitor  3. Obesity (BMI 30.0-34.9) Down another 3lbs and has been steadily losing weight with changing his diet and exercising while also taking topamax. Applauded on  continued success and will continue with weight loss goals.   General Counseling: nicholus chandran understanding of the findings of todays visit and agrees with plan of treatment. I have discussed any further diagnostic evaluation that may be needed or ordered today. We also reviewed his medications today. he has been encouraged to call the office with any questions or concerns that should arise related to todays visit.    No orders of the defined types were placed in this encounter.   No orders of the defined types were placed in this encounter.   This patient was seen by Lynn Ito, PA-C in collaboration with Dr. Beverely Risen as a part of collaborative care agreement.   Total time spent:30 Minutes Time spent includes review of chart, medications, test results, and follow up plan with the patient.      Dr Lyndon Code Internal medicine

## 2022-03-24 ENCOUNTER — Ambulatory Visit: Payer: BC Managed Care – PPO | Admitting: Nurse Practitioner

## 2022-03-24 ENCOUNTER — Encounter: Payer: Self-pay | Admitting: Nurse Practitioner

## 2022-03-24 VITALS — BP 138/79 | HR 78 | Temp 98.4°F | Ht 73.0 in | Wt 259.4 lb

## 2022-03-24 DIAGNOSIS — R0602 Shortness of breath: Secondary | ICD-10-CM

## 2022-03-24 DIAGNOSIS — J4540 Moderate persistent asthma, uncomplicated: Secondary | ICD-10-CM | POA: Diagnosis not present

## 2022-03-24 MED ORDER — ALBUTEROL SULFATE HFA 108 (90 BASE) MCG/ACT IN AERS: 1.0000 | INHALATION_SPRAY | Freq: Four times a day (QID) | RESPIRATORY_TRACT | 6 refills | Status: DC | PRN

## 2022-03-24 NOTE — Progress Notes (Unsigned)
[facility] [address] [place], [place] [address]  Internal MEDICINE  Office Visit Note  Patient Name: Luis Hendrix  [id number]  [id number]  Date of Service: 03/24/2022  Chief Complaint  Patient presents with   Follow-up   Asthma    HPI [name] presents for a follow-up visit for  Hx mild intermittent asthma on symbicort twice daily.  Controleld no flare up or attack in many years Has albuterol rescue inhaler if needed.  No uri since last visit in march  Spiro improved   Current Medication: Outpatient Encounter Medications as of 03/24/2022  Medication Sig   albuterol (VENTOLIN HFA) 108 (90 Base) MCG/ACT inhaler Inhale 1-2 puffs into the lungs every 6 (six) hours as needed for wheezing or shortness of breath.   colchicine 0.6 MG tablet Take 1 tablet by mouth daily as needed for acute gout flare.   lisinopril (ZESTRIL) 20 MG tablet TAKE ONE TABLET BY MOUTH DAILY   SYMBICORT 160-4.5 MCG/ACT inhaler Inhale 1 puff into the lungs 2 (two) times daily as needed.   topiramate (TOPAMAX) 25 MG tablet TAKE ONE TABLET BY MOUTH TWICE A DAY   [DISCONTINUED] albuterol (PROAIR HFA) 108 (90 Base) MCG/ACT inhaler ProAir HFA 90 mcg/actuation aerosol inhaler   [DISCONTINUED] budesonide-formoterol (SYMBICORT) 160-4.5 MCG/ACT inhaler Inhale 2 puffs into the lungs 2 (two) times daily.   [DISCONTINUED] rosuvastatin (CRESTOR) 5 MG tablet Take 1 tablet (5 mg total) by mouth daily.   No facility-administered encounter medications on file as of 03/24/2022.    Surgical History: Past Surgical History:  Procedure Laterality Date   COLONOSCOPY WITH PROPOFOL N/A 02/26/2020   Procedure: COLONOSCOPY WITH PROPOFOL;  Surgeon: Toney Reil, MD;  Location: [facility] ENDOSCOPY;  Service: Gastroenterology;  Laterality: N/A;   DIAGNOSTIC LAPAROSCOPY     ELBOW FRACTURE SURGERY Right    Approx age 26   FRACTURE SURGERY     LAPAROSCOPIC GASTRIC SLEEVE RESECTION      Medical History: Past  Medical History:  Diagnosis Date   Asthma    Hypertension    Sleep apnea     Family History: Family History  Problem Relation Age of Onset   Hypertension Father     Social History   Socioeconomic History   Marital status: Married    Spouse name: Not on file   Number of children: Not on file   Years of education: Not on file   Highest education level: Not on file  Occupational History   Not on file  Tobacco Use   Smoking status: Never   Smokeless tobacco: Never  Vaping Use   Vaping Use: Never used  Substance and Sexual Activity   Alcohol use: Yes    Comment: OCCASIONALLY   Drug use: No   Sexual activity: Yes    Partners: Female  Other Topics Concern   Not on file  Social History Narrative   Not on file   Social Determinants of Health   Financial Resource Strain: Not on file  Food Insecurity: Not on file  Transportation Needs: Not on file  Physical Activity: Not on file  Stress: Not on file  Social Connections: Not on file  Intimate Partner Violence: Not on file      Review of Systems  Vital Signs: BP 138/79   Pulse 78   Temp 98.4 F (36.9 C)   Ht 6\' 1"  (1.854 m)   Wt 259 lb 6.4 oz (117.7 kg)   SpO2 98%   BMI 34.22 kg/m  Physical Exam     Assessment/Plan:   General Counseling: Luis Hendrix understanding of the findings of todays visit and agrees with plan of treatment. I have discussed any further diagnostic evaluation that may be needed or ordered today. We also reviewed his medications today. he has been encouraged to call the office with any questions or concerns that should arise related to todays visit.    Orders Placed This Encounter  Procedures   Spirometry with Graph    Meds ordered this encounter  Medications   albuterol (VENTOLIN HFA) 108 (90 Base) MCG/ACT inhaler    Sig: Inhale 1-2 puffs into the lungs every 6 (six) hours as needed for wheezing or shortness of breath.    Dispense:  8 g    Refill:  6    Return  in about 1 year (around 03/25/2023) for F/U, pulmonary only with spiro DSK or Samari Gorby.   Total time spent:*** Minutes Time spent includes review of chart, medications, test results, and follow up plan with the patient.   [place] Controlled Substance Database was reviewed by me.  This patient was seen by Sallyanne Kuster, FNP-C in collaboration with Dr. Beverely Risen as a part of collaborative care agreement.   Miriam Liles R. Tedd Sias, MSN, FNP-C Internal medicine

## 2022-03-26 ENCOUNTER — Encounter: Payer: Self-pay | Admitting: Nurse Practitioner

## 2022-04-05 ENCOUNTER — Other Ambulatory Visit: Payer: Self-pay

## 2022-04-05 DIAGNOSIS — I10 Essential (primary) hypertension: Secondary | ICD-10-CM

## 2022-04-05 MED ORDER — LISINOPRIL 20 MG PO TABS: 20.0000 mg | ORAL_TABLET | Freq: Every day | ORAL | 1 refills | Status: DC

## 2022-04-14 ENCOUNTER — Other Ambulatory Visit: Payer: Self-pay | Admitting: Physician Assistant

## 2022-04-14 DIAGNOSIS — E669 Obesity, unspecified: Secondary | ICD-10-CM

## 2022-04-15 ENCOUNTER — Other Ambulatory Visit: Payer: Self-pay

## 2022-04-15 DIAGNOSIS — I10 Essential (primary) hypertension: Secondary | ICD-10-CM

## 2022-04-15 MED ORDER — LISINOPRIL 20 MG PO TABS: 20.0000 mg | ORAL_TABLET | Freq: Every day | ORAL | 1 refills | Status: DC

## 2022-04-21 ENCOUNTER — Encounter: Payer: BC Managed Care – PPO | Admitting: Physician Assistant

## 2022-04-28 ENCOUNTER — Encounter: Payer: BC Managed Care – PPO | Admitting: Physician Assistant

## 2022-05-03 ENCOUNTER — Telehealth: Payer: Self-pay | Admitting: Physician Assistant

## 2022-05-03 NOTE — Telephone Encounter (Signed)
Left vm to confirm 05/09/22 appointment-Toni

## 2022-05-09 ENCOUNTER — Ambulatory Visit (INDEPENDENT_AMBULATORY_CARE_PROVIDER_SITE_OTHER): Payer: BC Managed Care – PPO | Admitting: Physician Assistant

## 2022-05-09 ENCOUNTER — Encounter: Payer: Self-pay | Admitting: Physician Assistant

## 2022-05-09 VITALS — BP 136/79 | HR 85 | Temp 97.6°F | Resp 16 | Ht 73.0 in | Wt 261.8 lb

## 2022-05-09 DIAGNOSIS — E669 Obesity, unspecified: Secondary | ICD-10-CM

## 2022-05-09 DIAGNOSIS — I10 Essential (primary) hypertension: Secondary | ICD-10-CM | POA: Diagnosis not present

## 2022-05-09 DIAGNOSIS — E785 Hyperlipidemia, unspecified: Secondary | ICD-10-CM

## 2022-05-09 DIAGNOSIS — Z0001 Encounter for general adult medical examination with abnormal findings: Secondary | ICD-10-CM

## 2022-05-09 DIAGNOSIS — R5383 Other fatigue: Secondary | ICD-10-CM

## 2022-05-09 DIAGNOSIS — R300 Dysuria: Secondary | ICD-10-CM

## 2022-05-09 DIAGNOSIS — Z125 Encounter for screening for malignant neoplasm of prostate: Secondary | ICD-10-CM | POA: Diagnosis not present

## 2022-05-09 DIAGNOSIS — R7989 Other specified abnormal findings of blood chemistry: Secondary | ICD-10-CM

## 2022-05-09 DIAGNOSIS — M791 Myalgia, unspecified site: Secondary | ICD-10-CM

## 2022-05-09 MED ORDER — TETANUS-DIPHTH-ACELL PERTUSSIS 5-2.5-18.5 LF-MCG/0.5 IM SUSY: 0.5000 mL | PREFILLED_SYRINGE | Freq: Once | INTRAMUSCULAR | 0 refills | Status: AC

## 2022-05-09 NOTE — Progress Notes (Signed)
[facility] [address] [place], [place] [address]  Internal MEDICINE  Office Visit Note  Patient Name: Luis Hendrix  [id number]  [id number]  Date of Service: 05/18/2022  Chief Complaint  Patient presents with   Annual Exam   Hypertension   Quality Metric Gaps    Shingles Vaccine     HPI Pt is here for routine health maintenance examination -Is up 1 lbs since last visit, has identified cause for this and is getting back into routine again.  -Still doing well with topamax -Crestor causing pain in ankles and stopped again--will recheck labs and see if lower intensity statin indicated at this time -BP stable -Changed jobs and less stressful. 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   colchicine 0.6 MG tablet Take 1 tablet by mouth daily as needed for acute gout flare.   lisinopril (ZESTRIL) 20 MG tablet Take 1 tablet (20 mg total) by mouth daily.   SYMBICORT 160-4.5 MCG/ACT inhaler Inhale 1 puff into the lungs 2 (two) times daily as needed.   topiramate (TOPAMAX) 25 MG tablet TAKE 1 TABLET BY MOUTH TWICE A DAY   [DISCONTINUED] Tdap (BOOSTRIX) 5-2.5-18.5 LF-MCG/0.5 injection Inject 0.5 mLs into the muscle once.   [EXPIRED] Tdap (BOOSTRIX) 5-2.5-18.5 LF-MCG/0.5 injection Inject 0.5 mLs into the muscle once for 1 dose.   No facility-administered encounter medications on file as of 05/09/2022.    Surgical History: Past Surgical History:  Procedure Laterality Date   COLONOSCOPY WITH PROPOFOL N/A 02/26/2020   Procedure: COLONOSCOPY WITH PROPOFOL;  Surgeon: Toney Reil, MD;  Location: [facility]  ENDOSCOPY;  Service: Gastroenterology;  Laterality: N/A;   DIAGNOSTIC LAPAROSCOPY     ELBOW FRACTURE SURGERY Right    Approx age 84   FRACTURE SURGERY     LAPAROSCOPIC GASTRIC SLEEVE RESECTION      Medical History: Past Medical History:  Diagnosis Date   Asthma    Hypertension    Sleep apnea     Family History: Family History  Problem Relation Age of Onset   Hypertension Father       Review of Systems  Constitutional:  Negative for chills, fatigue and unexpected weight change.  HENT:  Negative for congestion, postnasal drip, rhinorrhea, sneezing and sore throat.   Eyes:  Negative for redness.  Respiratory:  Negative for cough, chest tightness and shortness of breath.   Cardiovascular:  Negative for chest pain and palpitations.  Gastrointestinal:  Negative for abdominal pain, constipation, diarrhea, nausea and vomiting.  Genitourinary:  Negative for dysuria and frequency.  Musculoskeletal:  Negative for arthralgias, back pain, joint swelling and neck pain.  Skin:  Negative for rash.  Neurological:  Negative for tremors and numbness.  Hematological:  Negative for adenopathy. Does not bruise/bleed easily.  Psychiatric/Behavioral:  Negative for behavioral problems (Depression), sleep disturbance and suicidal ideas. The patient is not nervous/anxious.      Vital Signs: BP 136/79   Pulse 85   Temp 97.6 F (36.4 C)   Resp 16   Ht 6\' 1"  (1.854 m)   Wt 261 lb 12.8 oz (118.8 kg)   SpO2 98%   BMI 34.54 kg/m  Physical Exam Vitals and nursing note reviewed.  Constitutional:      General: He is not in acute distress.    Appearance: Normal appearance. He is well-developed. He is obese. He is not diaphoretic.  HENT:     Head: Normocephalic and atraumatic.     Mouth/Throat:     Pharynx: No oropharyngeal exudate.  Eyes:     Pupils: Pupils are equal, round, and reactive to light.  Neck:     Thyroid: No thyromegaly.     Vascular: No JVD.     Trachea: No tracheal  deviation.  Cardiovascular:     Rate and Rhythm: Normal rate and regular rhythm.     Heart sounds: Normal heart sounds. No murmur heard.    No friction rub. No gallop.  Pulmonary:     Effort: Pulmonary effort is normal. No respiratory distress.     Breath sounds: No wheezing or rales.  Chest:     Chest wall: No tenderness.  Abdominal:     General: Bowel sounds are normal.     Palpations: Abdomen is soft.     Tenderness: There is no abdominal tenderness.  Musculoskeletal:        General: Normal range of motion.     Cervical back: Normal range of motion and neck supple.     Right lower leg: No edema.     Left lower leg: No edema.  Lymphadenopathy:     Cervical: No cervical adenopathy.  Skin:    General: Skin is warm and dry.  Neurological:     Mental Status: He is alert and oriented to person, place, and time.     Cranial Nerves: No cranial nerve deficit.  Psychiatric:        Behavior: Behavior normal.        Thought Content: Thought content normal.        Judgment: Judgment normal.      LABS: Recent Results (from the past 2160 hour(s))  UA/M w/rflx Culture, Routine     Status: Abnormal   Collection Time: 05/09/22  9:00 AM   Specimen: Urine   Urine  Result Value Ref Range   Specific Gravity, UA 1.016 1.005 - 1.030   pH, UA 6.0 5.0 - 7.5   Color, UA Yellow Yellow   Appearance Ur Clear Clear   Leukocytes,UA Trace (A) Negative   Protein,UA Negative Negative/Trace   Glucose, UA Negative Negative   Ketones, UA Negative Negative   RBC, UA Negative Negative   Bilirubin, UA Negative Negative   Urobilinogen, Ur 1.0 0.2 - 1.0 mg/dL   Nitrite, UA Negative Negative   Microscopic Examination See below:     Comment: Microscopic was indicated and was performed.   Urinalysis Reflex Comment     Comment: This specimen has reflexed to a Urine Culture.  Microscopic Examination     Status: None   Collection Time: 05/09/22  9:00 AM   Urine  Result Value Ref Range   WBC, UA None  seen 0 - 5 /hpf   RBC, Urine None seen 0 - 2 /hpf   Epithelial Cells (non renal) None seen 0 - 10 /hpf   Casts None seen None seen /lpf   Bacteria, UA None seen None seen/Few  Urine Culture, Reflex     Status: None   Collection Time: 05/09/22  9:00 AM   Urine  Result Value Ref Range   Urine Culture, Routine Final report    Organism ID, Bacteria Comment  Comment: Culture shows less than 10,000 colony forming units of bacteria per milliliter of urine. This colony count is not generally considered to be clinically significant.         Assessment/Plan: 1. Encounter for general adult medical examination with abnormal findings Cpe performed, routine labs ordered, UTD on colonoscopy, due for tdap - CBC w/Diff/Platelet - Comprehensive metabolic panel - Lipid Panel With LDL/HDL Ratio - TSH + free T4 - PSA Total (Reflex To Free)  2. Essential hypertension Stable, continue current medication  3. Hyperlipidemia LDL goal <100 Unable to tolerate crestor, will recheck labs and reconsider trying alternative low intensity statin or zetia if needed - Lipid Panel With LDL/HDL Ratio  4. Special screening for malignant neoplasm of prostate - PSA Total (Reflex To Free)  5. Abnormal thyroid blood test - TSH + free T4  6. Other fatigue - CBC w/Diff/Platelet - Comprehensive metabolic panel - Lipid Panel With LDL/HDL Ratio - TSH + free T4 - PSA Total (Reflex To Free)  7. Myalgia due to statin Crestor stopped  8. Obesity (BMI 30-39.9) Up 1 lbs since last visit and will get back to diet and exercise routine as he was making good progress with weight loss. May continue topamax  9. Dysuria - UA/M w/rflx Culture, Routine   General Counseling: cord wilczynski understanding of the findings of todays visit and agrees with plan of treatment. I have discussed any further diagnostic evaluation that may be needed or ordered today. We also reviewed his medications today. he has been  encouraged to call the office with any questions or concerns that should arise related to todays visit.    Counseling:    Orders Placed This Encounter  Procedures   Microscopic Examination   Urine Culture, Reflex   UA/M w/rflx Culture, Routine   CBC w/Diff/Platelet   Comprehensive metabolic panel   Lipid Panel With LDL/HDL Ratio   TSH + free T4   PSA Total (Reflex To Free)    Meds ordered this encounter  Medications   Tdap (BOOSTRIX) 5-2.5-18.5 LF-MCG/0.5 injection    Sig: Inject 0.5 mLs into the muscle once for 1 dose.    Dispense:  0.5 mL    Refill:  0    This patient was seen by Drema Dallas, PA-C in collaboration with Dr. Clayborn Bigness as a part of collaborative care agreement.  Total time spent:35 Minutes  Time spent includes review of chart, medications, test results, and follow up plan with the patient.     Lavera Guise, MD  Internal Medicine

## 2022-05-12 LAB — MICROSCOPIC EXAMINATION
Bacteria, UA: NONE SEEN
Casts: NONE SEEN /lpf
Epithelial Cells (non renal): NONE SEEN /hpf (ref 0–10)
RBC, Urine: NONE SEEN /hpf (ref 0–2)
WBC, UA: NONE SEEN /hpf (ref 0–5)

## 2022-05-12 LAB — UA/M W/RFLX CULTURE, ROUTINE
Bilirubin, UA: NEGATIVE
Glucose, UA: NEGATIVE
Ketones, UA: NEGATIVE
Nitrite, UA: NEGATIVE
Protein,UA: NEGATIVE
RBC, UA: NEGATIVE
Specific Gravity, UA: 1.016 (ref 1.005–1.030)
Urobilinogen, Ur: 1 mg/dL (ref 0.2–1.0)
pH, UA: 6 (ref 5.0–7.5)

## 2022-05-12 LAB — URINE CULTURE, REFLEX

## 2022-05-26 LAB — COMPREHENSIVE METABOLIC PANEL
ALT: 19 IU/L (ref 0–44)
AST: 23 IU/L (ref 0–40)
Albumin/Globulin Ratio: 1.9 (ref 1.2–2.2)
Albumin: 4.5 g/dL (ref 3.8–4.9)
Alkaline Phosphatase: 63 IU/L (ref 44–121)
BUN/Creatinine Ratio: 14 (ref 9–20)
BUN: 15 mg/dL (ref 6–24)
Bilirubin Total: 0.7 mg/dL (ref 0.0–1.2)
CO2: 24 mmol/L (ref 20–29)
Calcium: 9.8 mg/dL (ref 8.7–10.2)
Chloride: 102 mmol/L (ref 96–106)
Creatinine, Ser: 1.1 mg/dL (ref 0.76–1.27)
Globulin, Total: 2.4 g/dL (ref 1.5–4.5)
Glucose: 83 mg/dL (ref 70–99)
Potassium: 4.3 mmol/L (ref 3.5–5.2)
Sodium: 139 mmol/L (ref 134–144)
Total Protein: 6.9 g/dL (ref 6.0–8.5)
eGFR: 80 mL/min/{1.73_m2} (ref >59)

## 2022-05-26 LAB — CBC WITH DIFFERENTIAL/PLATELET
Basophils Absolute: 0 10*3/uL (ref 0.0–0.2)
Basos: 1 %
EOS (ABSOLUTE): 0.1 10*3/uL (ref 0.0–0.4)
Eos: 2 %
Hematocrit: 43.4 % (ref 37.5–51.0)
Hemoglobin: 14.1 g/dL (ref 13.0–17.7)
Immature Grans (Abs): 0 10*3/uL (ref 0.0–0.1)
Immature Granulocytes: 0 %
Lymphocytes Absolute: 1.5 10*3/uL (ref 0.7–3.1)
Lymphs: 33 %
MCH: 27.2 pg (ref 26.6–33.0)
MCHC: 32.5 g/dL (ref 31.5–35.7)
MCV: 84 fL (ref 79–97)
Monocytes Absolute: 0.4 10*3/uL (ref 0.1–0.9)
Monocytes: 8 %
Neutrophils Absolute: 2.6 10*3/uL (ref 1.4–7.0)
Neutrophils: 56 %
Platelets: 244 10*3/uL (ref 150–450)
RBC: 5.18 x10E6/uL (ref 4.14–5.80)
RDW: 12.9 % (ref 11.6–15.4)
WBC: 4.7 10*3/uL (ref 3.4–10.8)

## 2022-05-26 LAB — LIPID PANEL WITH LDL/HDL RATIO
Cholesterol, Total: 178 mg/dL (ref 100–199)
HDL: 73 mg/dL (ref >39)
LDL Chol Calc (NIH): 96 mg/dL (ref 0–99)
LDL/HDL Ratio: 1.3 ratio (ref 0.0–3.6)
Triglycerides: 41 mg/dL (ref 0–149)
VLDL Cholesterol Cal: 9 mg/dL (ref 5–40)

## 2022-05-26 LAB — PSA TOTAL (REFLEX TO FREE): Prostate Specific Ag, Serum: 0.8 ng/mL (ref 0.0–4.0)

## 2022-05-26 LAB — TSH+FREE T4
Free T4: 1.04 ng/dL (ref 0.82–1.77)
TSH: 1.8 u[IU]/mL (ref 0.450–4.500)

## 2022-05-30 ENCOUNTER — Telehealth: Payer: Self-pay

## 2022-05-30 NOTE — Telephone Encounter (Signed)
-----   Message from Carlean Jews, PA-C sent at 05/30/2022  1:09 PM EST ----- Please let him know that his labs have improved and cholesterol does look better therefore will continue to monitor now that he is off the medication and we may consider lower intensity medication in future

## 2022-05-30 NOTE — Telephone Encounter (Signed)
Spoke with patient regarding labs and urine culture results.

## 2022-06-24 ENCOUNTER — Telehealth: Payer: Self-pay | Admitting: Internal Medicine

## 2022-06-24 NOTE — Telephone Encounter (Signed)
Lvm to schedule pft-Toni 

## 2022-06-25 ENCOUNTER — Other Ambulatory Visit: Payer: Self-pay | Admitting: Physician Assistant

## 2022-06-25 DIAGNOSIS — E669 Obesity, unspecified: Secondary | ICD-10-CM

## 2022-06-30 ENCOUNTER — Other Ambulatory Visit: Payer: Self-pay

## 2022-06-30 DIAGNOSIS — E669 Obesity, unspecified: Secondary | ICD-10-CM

## 2022-06-30 MED ORDER — TOPIRAMATE 25 MG PO TABS: 25.0000 mg | ORAL_TABLET | Freq: Two times a day (BID) | ORAL | 3 refills | Status: DC

## 2022-08-08 ENCOUNTER — Ambulatory Visit (INDEPENDENT_AMBULATORY_CARE_PROVIDER_SITE_OTHER): Payer: BC Managed Care – PPO | Admitting: Physician Assistant

## 2022-08-08 ENCOUNTER — Encounter: Payer: Self-pay | Admitting: Physician Assistant

## 2022-08-08 VITALS — BP 138/90 | HR 78 | Temp 98.0°F | Resp 16 | Ht 73.0 in | Wt 271.0 lb

## 2022-08-08 DIAGNOSIS — E669 Obesity, unspecified: Secondary | ICD-10-CM | POA: Diagnosis not present

## 2022-08-08 DIAGNOSIS — I10 Essential (primary) hypertension: Secondary | ICD-10-CM | POA: Diagnosis not present

## 2022-08-08 NOTE — Progress Notes (Signed)
[facility] [address] [place], [place] [address]  Internal MEDICINE  Office Visit Note  Patient Name: Luis Hendrix  [id number]  [id number]  Date of Service: 08/08/2022  Chief Complaint  Patient presents with   Follow-up   Hypertension    HPI Pt is here for routine follow up -still exercising regularly, but eating more at work and is working on saying no to this now -had been doing well with weight loss, but recently put some back on and may be impacting BP as well -Does mention his 17yo daughter has been giving him some trouble so some increased stress as well -working on increasing veggies -doing 1 tab topamax nightly   Current Medication: Outpatient Encounter Medications as of 08/08/2022  Medication Sig   albuterol (VENTOLIN HFA) 108 (90 Base) MCG/ACT inhaler Inhale 1-2 puffs into the lungs every 6 (six) hours as needed for wheezing or shortness of breath.   colchicine 0.6 MG tablet Take 1 tablet by mouth daily as needed for acute gout flare.   lisinopril (ZESTRIL) 20 MG tablet Take 1 tablet (20 mg total) by mouth daily.   SYMBICORT 160-4.5 MCG/ACT inhaler Inhale 1 puff into the lungs 2 (two) times daily as needed.   topiramate (TOPAMAX) 25 MG tablet Take 1 tablet (25 mg total) by mouth 2 (two) times daily.   No facility-administered encounter medications on file as of 08/08/2022.    Surgical History: Past Surgical History:  Procedure Laterality Date   COLONOSCOPY WITH PROPOFOL N/A 02/26/2020   Procedure: COLONOSCOPY WITH PROPOFOL;  Surgeon: Toney Reil, MD;  Location: [facility] ENDOSCOPY;  Service: Gastroenterology;  Laterality: N/A;   DIAGNOSTIC LAPAROSCOPY     ELBOW FRACTURE SURGERY Right    Approx age 31   FRACTURE SURGERY     LAPAROSCOPIC GASTRIC SLEEVE RESECTION      Medical History: Past Medical History:  Diagnosis Date   Asthma    Hypertension    Sleep apnea     Family History: Family History  Problem Relation Age of Onset    Hypertension Father     Social History   Socioeconomic History   Marital status: Married    Spouse name: Not on file   Number of children: Not on file   Years of education: Not on file   Highest education level: Not on file  Occupational History   Not on file  Tobacco Use   Smoking status: Never   Smokeless tobacco: Never  Vaping Use   Vaping Use: Never used  Substance and Sexual Activity   Alcohol use: Yes    Comment: OCCASIONALLY   Drug use: No   Sexual activity: Yes    Partners: Female  Other Topics Concern   Not on file  Social History Narrative   Not on file   Social Determinants of Health   Financial Resource Strain: Not on file  Food Insecurity: Not on file  Transportation Needs: Not on file  Physical Activity: Not on file  Stress: Not on file  Social Connections: Not on file  Intimate Partner Violence: Not on file      Review of Systems  Constitutional:  Negative for chills, fatigue and unexpected weight change.  HENT:  Negative for congestion, postnasal drip, rhinorrhea, sneezing and sore throat.   Eyes:  Negative for redness.  Respiratory:  Negative for cough, chest tightness and shortness of breath.   Cardiovascular:  Negative for chest pain and palpitations.  Gastrointestinal:  Negative for abdominal  pain, constipation, diarrhea, nausea and vomiting.  Genitourinary:  Negative for dysuria and frequency.  Musculoskeletal:  Negative for arthralgias, back pain, joint swelling and neck pain.  Skin:  Negative for rash.  Neurological:  Negative for tremors and numbness.  Hematological:  Negative for adenopathy. Does not bruise/bleed easily.  Psychiatric/Behavioral:  Negative for behavioral problems (Depression), sleep disturbance and suicidal ideas. The patient is not nervous/anxious.     Vital Signs: BP (!) 138/90 Comment: 142/90  Pulse 78   Temp 98 F (36.7 C)   Resp 16   Ht 6\' 1"  (1.854 m)   Wt 271 lb (122.9 kg)   SpO2 99%   BMI 35.75 kg/m     Physical Exam Vitals and nursing note reviewed.  Constitutional:      General: He is not in acute distress.    Appearance: Normal appearance. He is well-developed. He is obese. He is not diaphoretic.  HENT:     Head: Normocephalic and atraumatic.     Mouth/Throat:     Pharynx: No oropharyngeal exudate.  Eyes:     Pupils: Pupils are equal, round, and reactive to light.  Neck:     Thyroid: No thyromegaly.     Vascular: No JVD.     Trachea: No tracheal deviation.  Cardiovascular:     Rate and Rhythm: Normal rate and regular rhythm.     Heart sounds: Normal heart sounds. No murmur heard.    No friction rub. No gallop.  Pulmonary:     Effort: Pulmonary effort is normal. No respiratory distress.     Breath sounds: No wheezing or rales.  Chest:     Chest wall: No tenderness.  Abdominal:     General: Bowel sounds are normal.     Palpations: Abdomen is soft.  Musculoskeletal:        General: Normal range of motion.     Cervical back: Normal range of motion and neck supple.     Right lower leg: No edema.     Left lower leg: No edema.  Lymphadenopathy:     Cervical: No cervical adenopathy.  Skin:    General: Skin is warm and dry.  Neurological:     Mental Status: He is alert and oriented to person, place, and time.     Cranial Nerves: No cranial nerve deficit.  Psychiatric:        Behavior: Behavior normal.        Thought Content: Thought content normal.        Judgment: Judgment normal.        Assessment/Plan: 1. Essential hypertension Borderline in office and will monitor and continue current medication  2. Obesity (BMI 30-39.9) Recently gained weight and is working on improving diet again and continuing exercise   General Counseling: Luis Hendrix understanding of the findings of todays visit and agrees with plan of treatment. I have discussed any further diagnostic evaluation that may be needed or ordered today. We also reviewed his medications today. he  has been encouraged to call the office with any questions or concerns that should arise related to todays visit.    No orders of the defined types were placed in this encounter.   No orders of the defined types were placed in this encounter.   This patient was seen by Drema Dallas, PA-C in collaboration with Dr. Clayborn Bigness as a part of collaborative care agreement.   Total time spent:30 Minutes Time spent includes review of chart, medications, test results, and  follow up plan with the patient.      Dr Lavera Guise Internal medicine

## 2022-10-10 ENCOUNTER — Ambulatory Visit (INDEPENDENT_AMBULATORY_CARE_PROVIDER_SITE_OTHER): Payer: BC Managed Care – PPO | Admitting: Physician Assistant

## 2022-10-10 ENCOUNTER — Encounter: Payer: Self-pay | Admitting: Physician Assistant

## 2022-10-10 VITALS — BP 144/92 | HR 82 | Temp 98.7°F | Resp 16 | Ht 73.0 in | Wt 268.8 lb

## 2022-10-10 DIAGNOSIS — I10 Essential (primary) hypertension: Secondary | ICD-10-CM | POA: Diagnosis not present

## 2022-10-10 DIAGNOSIS — E669 Obesity, unspecified: Secondary | ICD-10-CM | POA: Diagnosis not present

## 2022-10-10 MED ORDER — ZOSTER VAC RECOMB ADJUVANTED 50 MCG/0.5ML IM SUSR: 0.5000 mL | Freq: Once | INTRAMUSCULAR | 0 refills | Status: AC

## 2022-10-10 NOTE — Progress Notes (Signed)
[facility], [place] [address]  Internal MEDICINE  Office Visit Note  Patient Name: Luis Hendrix  [id number]  [id number]  Date of Service: 10/10/2022  Chief Complaint  Patient presents with   Hypertension   Follow-up    HPI Pt is here for routine follow up -Down another 3lbs since last visit -He is on a prednisone taper currently for joint pain in ankle, [facility] and did have xrays of right ankle and a little arthritis found -Bp normally well controlled at home, no vision changes or headaches. Has been ok at home as well. He will keep monitoring. -stress from 33yo daughter currently  Current Medication: Outpatient Encounter Medications as of 10/10/2022  Medication Sig   albuterol (VENTOLIN HFA) 108 (90 Base) MCG/ACT inhaler Inhale 1-2 puffs into the lungs every 6 (six) hours as needed for wheezing or shortness of breath.   colchicine 0.6 MG tablet Take 1 tablet by mouth daily as needed for acute gout flare.   lisinopril (ZESTRIL) 20 MG tablet Take 1 tablet (20 mg total) by mouth daily.   SYMBICORT 160-4.5 MCG/ACT inhaler Inhale 1 puff into the lungs 2 (two) times daily as needed.   topiramate (TOPAMAX) 25 MG tablet Take 1 tablet (25 mg total) by mouth 2 (two) times daily.   [DISCONTINUED] Zoster Vaccine Adjuvanted [facility]) injection Inject 0.5 mLs into the muscle once.   Zoster Vaccine Adjuvanted [facility]) injection Inject 0.5 mLs into the muscle once for 1 dose.   No facility-administered encounter medications on file as of 10/10/2022.    Surgical History: Past Surgical History:  Procedure Laterality Date   COLONOSCOPY WITH PROPOFOL N/A 02/26/2020   Procedure: COLONOSCOPY WITH PROPOFOL;  Surgeon: Lin Landsman, MD;  Location: [facility] ENDOSCOPY;  Service: Gastroenterology;  Laterality: N/A;   DIAGNOSTIC LAPAROSCOPY     ELBOW FRACTURE SURGERY Right    Approx age 74   FRACTURE SURGERY     LAPAROSCOPIC GASTRIC SLEEVE  RESECTION      Medical History: Past Medical History:  Diagnosis Date   Asthma    Hypertension    Sleep apnea     Family History: Family History  Problem Relation Age of Onset   Hypertension Father     Social History   Socioeconomic History   Marital status: Married    Spouse name: Not on file   Number of children: Not on file   Years of education: Not on file   Highest education level: Not on file  Occupational History   Not on file  Tobacco Use   Smoking status: Never   Smokeless tobacco: Never  Vaping Use   Vaping Use: Never used  Substance and Sexual Activity   Alcohol use: Yes    Comment: OCCASIONALLY   Drug use: No   Sexual activity: Yes    Partners: Female  Other Topics Concern   Not on file  Social History Narrative   Not on file   Social Determinants of Health   Financial Resource Strain: Not on file  Food Insecurity: Not on file  Transportation Needs: Not on file  Physical Activity: Not on file  Stress: Not on file  Social Connections: Not on file  Intimate Partner Violence: Not on file      Review of Systems  Constitutional:  Negative for chills, fatigue and unexpected weight change.  HENT:  Negative for congestion, postnasal drip, rhinorrhea, sneezing and sore throat.   Eyes:  Negative for redness.  Respiratory:  Negative for cough, chest tightness and shortness of breath.   Cardiovascular:  Negative for chest pain and palpitations.  Gastrointestinal:  Negative for abdominal pain, constipation, diarrhea, nausea and vomiting.  Genitourinary:  Negative for dysuria and frequency.  Musculoskeletal:  Negative for arthralgias, back pain, joint swelling and neck pain.  Skin:  Negative for rash.  Neurological:  Negative for tremors and numbness.  Hematological:  Negative for adenopathy. Does not bruise/bleed easily.  Psychiatric/Behavioral:  Negative for behavioral problems (Depression), sleep disturbance and suicidal ideas. The patient is not  nervous/anxious.     Vital Signs: BP (!) 144/92 Comment: 144/98  Pulse 82   Temp 98.7 F (37.1 C)   Resp 16   Ht 6\' 1"  (1.854 m)   Wt 268 lb 12.8 oz (121.9 kg)   SpO2 98%   BMI 35.46 kg/m    Physical Exam Vitals and nursing note reviewed.  Constitutional:      General: He is not in acute distress.    Appearance: Normal appearance. He is well-developed. He is obese. He is not diaphoretic.  HENT:     Head: Normocephalic and atraumatic.     Mouth/Throat:     Pharynx: No oropharyngeal exudate.  Eyes:     Pupils: Pupils are equal, round, and reactive to light.  Neck:     Thyroid: No thyromegaly.     Vascular: No JVD.     Trachea: No tracheal deviation.  Cardiovascular:     Rate and Rhythm: Normal rate and regular rhythm.     Heart sounds: Normal heart sounds. No murmur heard.    No friction rub. No gallop.  Pulmonary:     Effort: Pulmonary effort is normal. No respiratory distress.     Breath sounds: No wheezing or rales.  Chest:     Chest wall: No tenderness.  Abdominal:     General: Bowel sounds are normal.     Palpations: Abdomen is soft.  Musculoskeletal:        General: Normal range of motion.     Cervical back: Normal range of motion and neck supple.     Right lower leg: No edema.     Left lower leg: No edema.  Lymphadenopathy:     Cervical: No cervical adenopathy.  Skin:    General: Skin is warm and dry.  Neurological:     Mental Status: He is alert and oriented to person, place, and time.     Cranial Nerves: No cranial nerve deficit.  Psychiatric:        Behavior: Behavior normal.        Thought Content: Thought content normal.        Judgment: Judgment normal.        Assessment/Plan: 1. Essential hypertension Bp elevated in office, but is on prednisone and has increased stress with daughter. He will monitor closely and call if continued elevation. Otherwise recheck in 4 weeks  2. Obesity (BMI 30-39.9) Down 3lbs since last visit and will  continue to work on diet and exercise   General Counseling: dorie haxton understanding of the findings of todays visit and agrees with plan of treatment. I have discussed any further diagnostic evaluation that may be needed or ordered today. We also reviewed his medications today. he has been encouraged to call the office with any questions or concerns that should arise related to todays visit.    No orders of the defined types were placed in this encounter.   Meds ordered  this encounter  Medications   Zoster Vaccine Adjuvanted [facility]) injection    Sig: Inject 0.5 mLs into the muscle once for 1 dose.    Dispense:  0.5 mL    Refill:  0    This patient was seen by Drema Dallas, PA-C in collaboration with Dr. Clayborn Bigness as a part of collaborative care agreement.   Total time spent:30 Minutes Time spent includes review of chart, medications, test results, and follow up plan with the patient.      Dr Lavera Guise Internal medicine

## 2022-10-20 ENCOUNTER — Other Ambulatory Visit: Payer: Self-pay | Admitting: Physician Assistant

## 2022-10-20 DIAGNOSIS — E669 Obesity, unspecified: Secondary | ICD-10-CM

## 2022-11-07 ENCOUNTER — Ambulatory Visit (INDEPENDENT_AMBULATORY_CARE_PROVIDER_SITE_OTHER): Payer: BC Managed Care – PPO | Admitting: Physician Assistant

## 2022-11-07 ENCOUNTER — Encounter: Payer: Self-pay | Admitting: Physician Assistant

## 2022-11-07 VITALS — BP 130/70 | HR 80 | Temp 97.8°F | Resp 16 | Ht 73.0 in | Wt 267.0 lb

## 2022-11-07 DIAGNOSIS — E669 Obesity, unspecified: Secondary | ICD-10-CM | POA: Diagnosis not present

## 2022-11-07 DIAGNOSIS — I10 Essential (primary) hypertension: Secondary | ICD-10-CM | POA: Diagnosis not present

## 2022-11-07 NOTE — Progress Notes (Signed)
[facility] [address] 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 Starts May 20. Excited about change -Still exercising and working on diet and limiting salt, down 1 lb since last visit  Current Medication: Outpatient Encounter Medications as of 11/07/2022  Medication Sig   albuterol (VENTOLIN HFA) 108 (90 Base) MCG/ACT inhaler Inhale 1-2 puffs into the lungs every 6 (six) hours as needed for wheezing or shortness of breath.   colchicine 0.6 MG tablet Take 1 tablet by mouth daily as needed for acute gout flare.   lisinopril (ZESTRIL) 20 MG tablet Take 1 tablet (20 mg total) by mouth daily.   SYMBICORT 160-4.5 MCG/ACT inhaler Inhale 1 puff into the lungs 2 (two) times daily as needed.   topiramate (TOPAMAX) 25 MG tablet TAKE 1 TABLET BY MOUTH TWICE A DAY   No facility-administered encounter medications on file as of 11/07/2022.    Surgical History: Past Surgical History:  Procedure Laterality Date   COLONOSCOPY WITH PROPOFOL N/A 02/26/2020   Procedure: COLONOSCOPY WITH PROPOFOL;  Surgeon: Toney Reil, MD;  Location: [facility] ENDOSCOPY;  Service: Gastroenterology;  Laterality: N/A;   DIAGNOSTIC LAPAROSCOPY     ELBOW FRACTURE SURGERY Right    Approx age 43   FRACTURE SURGERY     LAPAROSCOPIC GASTRIC SLEEVE RESECTION      Medical History: Past Medical History:  Diagnosis Date   Asthma    Hypertension    Sleep apnea     Family History: Family History  Problem Relation Age of Onset   Hypertension Father     Social History   Socioeconomic History    Marital status: Married    Spouse name: Not on file   Number of children: Not on file   Years of education: Not on file   Highest education level: Not on file  Occupational History   Not on file  Tobacco Use   Smoking status: Never   Smokeless tobacco: Never  Vaping Use   Vaping Use: Never used  Substance and Sexual Activity   Alcohol use: Yes    Comment: OCCASIONALLY   Drug use: No   Sexual activity: Yes    Partners: Female  Other Topics Concern   Not on file  Social History Narrative   Not on file   Social Determinants of Health   Financial Resource Strain: Not on file  Food Insecurity: Not on file  Transportation Needs: Not on file  Physical Activity: Not on file  Stress: Not on file  Social Connections: Not on file  Intimate Partner Violence: Not on file      Review of Systems  Constitutional:  Negative for chills, fatigue and unexpected weight change.  HENT:  Negative for congestion, postnasal drip, rhinorrhea, sneezing and sore throat.   Eyes:  Negative for redness.  Respiratory:  Negative for cough, chest tightness and shortness of breath.   Cardiovascular:  Negative for chest pain and palpitations.  Gastrointestinal:  Negative for abdominal pain, constipation, diarrhea, nausea and vomiting.  Genitourinary:  Negative for dysuria and frequency.  Musculoskeletal:  Negative for arthralgias, back pain, joint swelling and neck pain.  Skin:  Negative for rash.  Neurological:  Negative for tremors and numbness.  Hematological:  Negative for adenopathy. Does not bruise/bleed easily.  Psychiatric/Behavioral:  Negative for behavioral problems (Depression), sleep disturbance and suicidal ideas. The patient is not nervous/anxious.     Vital Signs: BP 130/70   Pulse 80   Temp 97.8 F (36.6 C)   Resp 16   Ht  (1.854 m)   Wt 267 lb (121.1 kg)   SpO2 98%   BMI 35.23 kg/m    Physical Exam Vitals and nursing note reviewed.  Constitutional:      General: He  is not in acute distress.    Appearance: Normal appearance. He is well-developed. He is obese. He is not diaphoretic.  HENT:     Head: Normocephalic and atraumatic.     Mouth/Throat:     Pharynx: No oropharyngeal exudate.  Eyes:     Pupils: Pupils are equal, round, and reactive to light.  Neck:     Thyroid: No thyromegaly.     Vascular: No JVD.     Trachea: No tracheal deviation.  Cardiovascular:     Rate and Rhythm: Normal rate and regular rhythm.     Heart sounds: Normal heart sounds. No murmur heard.    No friction rub. No gallop.  Pulmonary:     Effort: Pulmonary effort is normal. No respiratory distress.     Breath sounds: No wheezing or rales.  Chest:     Chest wall: No tenderness.  Abdominal:     General: Bowel sounds are normal.     Palpations: Abdomen is soft.  Musculoskeletal:        General: Normal range of motion.     Cervical back: Normal range of motion and neck supple.     Right lower leg: No edema.     Left lower leg: No edema.  Lymphadenopathy:     Cervical: No cervical adenopathy.  Skin:    General: Skin is warm and dry.  Neurological:     Mental Status: He is alert and oriented to person, place, and time.     Cranial Nerves: No cranial nerve deficit.  Psychiatric:        Behavior: Behavior normal.        Thought Content: Thought content normal.        Judgment: Judgment normal.        Assessment/Plan: 1. Essential hypertension Stable, continue current medications  2. Obesity (BMI 30-39.9) Down 1 lb since last visit, encouraged to continue working on diet and exercise   General Counseling: jade burright understanding of the findings of todays visit and agrees with plan of treatment. I have discussed any further diagnostic evaluation that may be needed or ordered today. We also reviewed his medications today. he has been encouraged to call the office with any questions or concerns that should arise related to todays visit.    No orders of  the defined types were placed in this encounter.   No orders of the defined types were placed in this encounter.   This patient was seen by Lynn Ito, PA-C in collaboration with Dr. Beverely Risen as a part of collaborative care agreement.   Total time spent:30 Minutes Time spent includes review of chart, medications, test results, and follow up plan with the patient.      Dr Lyndon Code Internal medicine

## 2022-12-05 ENCOUNTER — Other Ambulatory Visit: Payer: Self-pay | Admitting: Physician Assistant

## 2022-12-05 DIAGNOSIS — J45909 Unspecified asthma, uncomplicated: Secondary | ICD-10-CM

## 2022-12-18 ENCOUNTER — Other Ambulatory Visit: Payer: Self-pay | Admitting: Physician Assistant

## 2022-12-18 DIAGNOSIS — E669 Obesity, unspecified: Secondary | ICD-10-CM

## 2023-02-09 ENCOUNTER — Ambulatory Visit (INDEPENDENT_AMBULATORY_CARE_PROVIDER_SITE_OTHER): Payer: BC Managed Care – PPO | Admitting: Physician Assistant

## 2023-02-09 ENCOUNTER — Encounter: Payer: Self-pay | Admitting: Physician Assistant

## 2023-02-09 VITALS — BP 130/88 | HR 79 | Temp 98.6°F | Resp 16 | Ht 73.0 in | Wt 270.8 lb

## 2023-02-09 DIAGNOSIS — I10 Essential (primary) hypertension: Secondary | ICD-10-CM

## 2023-02-09 DIAGNOSIS — E669 Obesity, unspecified: Secondary | ICD-10-CM

## 2023-02-09 NOTE — Progress Notes (Signed)
[facility] [address] [place], [place] [address]  Internal MEDICINE  Office Visit Note  Patient Name: Luis Hendrix  [id number]  [id number]  Date of Service: 02/09/2023  Chief Complaint  Patient presents with   Hypertension   Follow-up    HPI Pt is here for routine follow up -Been busy with camps this summer -BP at home 130/84 -breathing has been good -cutting back on popcorn, but eldest daughter is home from college and there are more chips and snacks in the house. Trying to be mindful, but may get into these some at night -Still exercising regularly, 4 mornings of the week with boot camp and spin class and then will walk on weekends, golf, and take care of lawn -states he is feeling great  Current Medication: Outpatient Encounter Medications as of 02/09/2023  Medication Sig   albuterol (VENTOLIN HFA) 108 (90 Base) MCG/ACT inhaler Inhale 1-2 puffs into the lungs every 6 (six) hours as needed for wheezing or shortness of breath.   budesonide-formoterol (SYMBICORT) 160-4.5 MCG/ACT inhaler INHALE ONE PUFF BY MOUTH TWICE A DAY AS NEEDED   colchicine 0.6 MG tablet Take 1 tablet by mouth daily as needed for acute gout flare.   lisinopril (ZESTRIL) 20 MG tablet Take 1 tablet (20 mg total) by mouth daily.   topiramate (TOPAMAX) 25 MG tablet TAKE 1 TABLET BY MOUTH 2 TIMES A DAY   No facility-administered encounter medications on file as of 02/09/2023.    Surgical History: Past Surgical History:  Procedure Laterality Date   COLONOSCOPY WITH PROPOFOL N/A 02/26/2020   Procedure: COLONOSCOPY WITH PROPOFOL;  Surgeon: Toney Reil, MD;  Location: [facility] ENDOSCOPY;  Service: Gastroenterology;  Laterality: N/A;   DIAGNOSTIC LAPAROSCOPY     ELBOW FRACTURE SURGERY Right    Approx age 57   FRACTURE SURGERY     LAPAROSCOPIC GASTRIC SLEEVE RESECTION      Medical History: Past Medical History:  Diagnosis Date   Asthma    Hypertension    Sleep apnea      Family History: Family History  Problem Relation Age of Onset   Hypertension Father     Social History   Socioeconomic History   Marital status: Married    Spouse name: Not on file   Number of children: Not on file   Years of education: Not on file   Highest education level: Not on file  Occupational History   Not on file  Tobacco Use   Smoking status: Never   Smokeless tobacco: Never  Vaping Use   Vaping status: Never Used  Substance and Sexual Activity   Alcohol use: Yes    Comment: OCCASIONALLY   Drug use: No   Sexual activity: Yes    Partners: Female  Other Topics Concern   Not on file  Social History Narrative   Not on file   Social Determinants of Health   Financial Resource Strain: Not on file  Food Insecurity: Not on file  Transportation Needs: Not on file  Physical Activity: Not on file  Stress: Not on file  Social Connections: Not on file  Intimate Partner Violence: Not on file      Review of Systems  Constitutional:  Negative for chills, fatigue and unexpected weight change.  HENT:  Negative for congestion, postnasal drip, rhinorrhea, sneezing and sore throat.   Eyes:  Negative for redness.  Respiratory:  Negative for cough, chest tightness and shortness of breath.   Cardiovascular:  Negative for  chest pain and palpitations.  Gastrointestinal:  Negative for abdominal pain, constipation, diarrhea, nausea and vomiting.  Genitourinary:  Negative for dysuria and frequency.  Musculoskeletal:  Negative for arthralgias, back pain, joint swelling and neck pain.  Skin:  Negative for rash.  Neurological:  Negative for dizziness, tremors, light-headedness and numbness.  Hematological:  Negative for adenopathy. Does not bruise/bleed easily.  Psychiatric/Behavioral:  Negative for behavioral problems (Depression), sleep disturbance and suicidal ideas. The patient is not nervous/anxious.     Vital Signs: BP 130/88   Pulse 79   Temp 98.6 F (37 C)    Resp 16   Ht 6\' 1"  (1.854 m)   Wt 270 lb 12.8 oz (122.8 kg)   SpO2 97%   BMI 35.73 kg/m    Physical Exam Vitals and nursing note reviewed.  Constitutional:      General: He is not in acute distress.    Appearance: Normal appearance. He is well-developed. He is obese. He is not diaphoretic.  HENT:     Head: Normocephalic and atraumatic.     Mouth/Throat:     Pharynx: No oropharyngeal exudate.  Eyes:     Pupils: Pupils are equal, round, and reactive to light.  Neck:     Thyroid: No thyromegaly.     Vascular: No JVD.     Trachea: No tracheal deviation.  Cardiovascular:     Rate and Rhythm: Normal rate and regular rhythm.     Heart sounds: Normal heart sounds. No murmur heard.    No friction rub. No gallop.  Pulmonary:     Effort: Pulmonary effort is normal. No respiratory distress.     Breath sounds: No wheezing or rales.  Chest:     Chest wall: No tenderness.  Abdominal:     General: Bowel sounds are normal.     Palpations: Abdomen is soft.  Musculoskeletal:        General: Normal range of motion.     Cervical back: Normal range of motion and neck supple.     Right lower leg: No edema.     Left lower leg: No edema.  Lymphadenopathy:     Cervical: No cervical adenopathy.  Skin:    General: Skin is warm and dry.  Neurological:     Mental Status: He is alert and oriented to person, place, and time.     Cranial Nerves: No cranial nerve deficit.  Psychiatric:        Behavior: Behavior normal.        Thought Content: Thought content normal.        Judgment: Judgment normal.        Assessment/Plan: 1. Essential hypertension Stable, continue current medication  2. Obesity (BMI 30-39.9) Continue to work on diet and exercise   General Counseling: keymarion bearman understanding of the findings of todays visit and agrees with plan of treatment. I have discussed any further diagnostic evaluation that may be needed or ordered today. We also reviewed his medications  today. he has been encouraged to call the office with any questions or concerns that should arise related to todays visit.    No orders of the defined types were placed in this encounter.   No orders of the defined types were placed in this encounter.   This patient was seen by Lynn Ito, PA-C in collaboration with Dr. Beverely Risen as a part of collaborative care agreement.   Total time spent:30 Minutes Time spent includes review of chart, medications, test results, and  follow up plan with the patient.      Dr Lyndon Code Internal medicine

## 2023-03-13 ENCOUNTER — Other Ambulatory Visit: Payer: Self-pay

## 2023-03-13 DIAGNOSIS — R0602 Shortness of breath: Secondary | ICD-10-CM

## 2023-03-22 ENCOUNTER — Ambulatory Visit (INDEPENDENT_AMBULATORY_CARE_PROVIDER_SITE_OTHER): Payer: BC Managed Care – PPO | Admitting: Internal Medicine

## 2023-03-22 DIAGNOSIS — R0602 Shortness of breath: Secondary | ICD-10-CM | POA: Diagnosis not present

## 2023-03-23 ENCOUNTER — Ambulatory Visit: Payer: BC Managed Care – PPO | Admitting: Nurse Practitioner

## 2023-03-28 ENCOUNTER — Other Ambulatory Visit: Payer: Self-pay | Admitting: Physician Assistant

## 2023-03-28 DIAGNOSIS — I10 Essential (primary) hypertension: Secondary | ICD-10-CM

## 2023-03-29 ENCOUNTER — Encounter: Payer: Self-pay | Admitting: Nurse Practitioner

## 2023-03-29 ENCOUNTER — Ambulatory Visit (INDEPENDENT_AMBULATORY_CARE_PROVIDER_SITE_OTHER): Payer: BC Managed Care – PPO | Admitting: Nurse Practitioner

## 2023-03-29 VITALS — BP 124/88 | HR 73 | Temp 98.2°F | Resp 16 | Ht 73.0 in | Wt 273.6 lb

## 2023-03-29 DIAGNOSIS — J4540 Moderate persistent asthma, uncomplicated: Secondary | ICD-10-CM | POA: Diagnosis not present

## 2023-03-29 DIAGNOSIS — R0602 Shortness of breath: Secondary | ICD-10-CM

## 2023-03-29 MED ORDER — ALBUTEROL SULFATE HFA 108 (90 BASE) MCG/ACT IN AERS: 1.0000 | INHALATION_SPRAY | Freq: Four times a day (QID) | RESPIRATORY_TRACT | 6 refills | Status: DC | PRN

## 2023-03-29 MED ORDER — BUDESONIDE-FORMOTEROL FUMARATE 160-4.5 MCG/ACT IN AERO: 2.0000 | INHALATION_SPRAY | Freq: Two times a day (BID) | RESPIRATORY_TRACT | 3 refills | Status: DC

## 2023-03-29 NOTE — Progress Notes (Signed)
[facility] [address] [place], [place] [address]  Internal MEDICINE  Office Visit Note  Patient Name: Luis Hendrix  [id number]  [id number]  Date of Service: 03/29/2023  Chief Complaint  Patient presents with   Follow-up    HPI Luis Hendrix presents for a follow-up visit for annual pulmonary visit for asthma He has moderate persistent asthma that is well controlled and without complication. He uses symbicort twice daily for maintenance. He has an albuterol rescue inhaler for episodes of SOB, cough or wheezing. He has not had any flare ups or attacks in several years. No hospitalizations in the past 12 months PFT results reviewed with the patient. Slight improvement since last year and improves with bronchodilator treatment.    Current Medication: Outpatient Encounter Medications as of 03/29/2023  Medication Sig   colchicine 0.6 MG tablet Take 1 tablet by mouth daily as needed for acute gout flare.   lisinopril (ZESTRIL) 20 MG tablet TAKE 1 TABLET BY MOUTH DAILY   topiramate (TOPAMAX) 25 MG tablet TAKE 1 TABLET BY MOUTH 2 TIMES A DAY   [DISCONTINUED] albuterol (VENTOLIN HFA) 108 (90 Base) MCG/ACT inhaler Inhale 1-2 puffs into the lungs every 6 (six) hours as needed for wheezing or shortness of breath.   [DISCONTINUED] budesonide-formoterol (SYMBICORT) 160-4.5 MCG/ACT inhaler INHALE ONE PUFF BY MOUTH TWICE A DAY AS NEEDED   albuterol (VENTOLIN HFA) 108 (90 Base) MCG/ACT inhaler Inhale 1-2 puffs into the lungs every 6 (six) hours as needed for wheezing or shortness of breath.   budesonide-formoterol (SYMBICORT) 160-4.5 MCG/ACT inhaler Inhale 2 puffs into the lungs in the morning and at bedtime.   No facility-administered encounter medications on file as of 03/29/2023.    Surgical History: Past Surgical History:  Procedure Laterality Date   COLONOSCOPY WITH PROPOFOL N/A 02/26/2020   Procedure: COLONOSCOPY WITH PROPOFOL;  Surgeon: Toney Reil, MD;  Location:  [facility] ENDOSCOPY;  Service: Gastroenterology;  Laterality: N/A;   DIAGNOSTIC LAPAROSCOPY     ELBOW FRACTURE SURGERY Right    Approx age 20   FRACTURE SURGERY     LAPAROSCOPIC GASTRIC SLEEVE RESECTION      Medical History: Past Medical History:  Diagnosis Date   Asthma    Hypertension    Sleep apnea     Family History: Family History  Problem Relation Age of Onset   Hypertension Father     Social History   Socioeconomic History   Marital status: Married    Spouse name: Not on file   Number of children: Not on file   Years of education: Not on file   Highest education level: Not on file  Occupational History   Not on file  Tobacco Use   Smoking status: Never   Smokeless tobacco: Never  Vaping Use   Vaping status: Never Used  Substance and Sexual Activity   Alcohol use: Yes    Comment: OCCASIONALLY   Drug use: No   Sexual activity: Yes    Partners: Female  Other Topics Concern   Not on file  Social History Narrative   Not on file   Social Determinants of Health   Financial Resource Strain: Not on file  Food Insecurity: Not on file  Transportation Needs: Not on file  Physical Activity: Not on file  Stress: Not on file  Social Connections: Not on file  Intimate Partner Violence: Not on file      Review of Systems  Constitutional:  Negative for chills, fatigue and unexpected weight  change.  HENT:  Positive for postnasal drip. Negative for congestion, rhinorrhea, sneezing and sore throat.   Eyes:  Negative for redness.  Respiratory:  Negative for cough, chest tightness, shortness of breath and wheezing.   Cardiovascular:  Negative for chest pain and palpitations.  Gastrointestinal:  Negative for abdominal pain, constipation, diarrhea, nausea and vomiting.  Genitourinary:  Negative for dysuria and frequency.  Musculoskeletal:  Negative for arthralgias, back pain, joint swelling and neck pain.  Skin:  Negative for rash.  Allergic/Immunologic: Positive for  environmental allergies.  Neurological: Negative.  Negative for tremors and numbness.  Hematological:  Negative for adenopathy. Does not bruise/bleed easily.  Psychiatric/Behavioral:  Negative for behavioral problems (Depression), sleep disturbance and suicidal ideas. The patient is not nervous/anxious.     Vital Signs: BP 124/88   Pulse 73   Temp 98.2 F (36.8 C)   Resp 16   Ht 6\' 1"  (1.854 m)   Wt 273 lb 9.6 oz (124.1 kg)   SpO2 96%   BMI 36.10 kg/m    Physical Exam Vitals reviewed.  Constitutional:      General: He is not in acute distress.    Appearance: Normal appearance. He is obese. He is not ill-appearing.  HENT:     Head: Normocephalic and atraumatic.  Eyes:     Pupils: Pupils are equal, round, and reactive to light.  Cardiovascular:     Rate and Rhythm: Normal rate and regular rhythm.     Heart sounds: Normal heart sounds. No murmur heard. Pulmonary:     Effort: Pulmonary effort is normal. No respiratory distress.     Breath sounds: Normal breath sounds. No wheezing.  Neurological:     Mental Status: He is alert and oriented to person, place, and time.  Psychiatric:        Mood and Affect: Mood normal.        Behavior: Behavior normal.        Assessment/Plan: 1. Moderate persistent asthma without complication Stable, continue albuterol prn and symbicort inhaler as prescribed.  - albuterol (VENTOLIN HFA) 108 (90 Base) MCG/ACT inhaler; Inhale 1-2 puffs into the lungs every 6 (six) hours as needed for wheezing or shortness of breath.  Dispense: 8 g; Refill: 6 - budesonide-formoterol (SYMBICORT) 160-4.5 MCG/ACT inhaler; Inhale 2 puffs into the lungs in the morning and at bedtime.  Dispense: 3 each; Refill: 3  2. SOB (shortness of breath) Stable, refills ordered, continue albuterol inhaler as needed  - albuterol (VENTOLIN HFA) 108 (90 Base) MCG/ACT inhaler; Inhale 1-2 puffs into the lungs every 6 (six) hours as needed for wheezing or shortness of breath.   Dispense: 8 g; Refill: 6   General Counseling: Luis Hendrix verbalizes understanding of the findings of todays visit and agrees with plan of treatment. I have discussed any further diagnostic evaluation that may be needed or ordered today. We also reviewed his medications today. he has been encouraged to call the office with any questions or concerns that should arise related to todays visit.    No orders of the defined types were placed in this encounter.   Meds ordered this encounter  Medications   albuterol (VENTOLIN HFA) 108 (90 Base) MCG/ACT inhaler    Sig: Inhale 1-2 puffs into the lungs every 6 (six) hours as needed for wheezing or shortness of breath.    Dispense:  8 g    Refill:  6   budesonide-formoterol (SYMBICORT) 160-4.5 MCG/ACT inhaler    Sig: Inhale 2 puffs into the  lungs in the morning and at bedtime.    Dispense:  3 each    Refill:  3    Future refills    Return in about 1 year (around 03/28/2024) for F/U, pulmonary only, Luis Hendrix following repeat PFT in 1 year .   Total time spent:20 Minutes Time spent includes review of chart, medications, test results, and follow up plan with the patient.   [place] Controlled Substance Database was reviewed by me.  This patient was seen by Sallyanne Kuster, FNP-C in collaboration with Dr. Beverely Risen as a part of collaborative care agreement.   Ahria Slappey R. Tedd Sias, MSN, FNP-C Internal medicine

## 2023-05-14 NOTE — Procedures (Signed)
[facility] [address] [place], [address]    Complete Pulmonary Function Testing Interpretation:  FINDINGS:  Forced vital capacity is normal.  FEV1 is 2.61 L which is 74% of predicted and is mildly decreased.  FEV1 FVC ratio is mildly decreased.  Postbronchodilator no significant change in FEV1 is noted.  Total lung capacity is mildly decreased.  Residual volume is decreased.  FRC is decreased but DLCO is increased.  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  Yevonne Pax, MD [facility] Pulmonary Critical Care Medicine Sleep Medicine

## 2023-05-15 ENCOUNTER — Encounter: Payer: BC Managed Care – PPO | Admitting: Physician Assistant

## 2023-05-18 ENCOUNTER — Telehealth: Payer: Self-pay | Admitting: Physician Assistant

## 2023-05-18 NOTE — Telephone Encounter (Signed)
Left vm and sent mychart message to confirm 05/25/23 appointment-Toni

## 2023-05-25 ENCOUNTER — Ambulatory Visit (INDEPENDENT_AMBULATORY_CARE_PROVIDER_SITE_OTHER): Payer: BC Managed Care – PPO | Admitting: Physician Assistant

## 2023-05-25 ENCOUNTER — Encounter: Payer: Self-pay | Admitting: Physician Assistant

## 2023-05-25 VITALS — BP 112/88 | HR 71 | Temp 98.1°F | Resp 16 | Ht 73.0 in | Wt 274.4 lb

## 2023-05-25 DIAGNOSIS — Z0001 Encounter for general adult medical examination with abnormal findings: Secondary | ICD-10-CM | POA: Diagnosis not present

## 2023-05-25 DIAGNOSIS — Z125 Encounter for screening for malignant neoplasm of prostate: Secondary | ICD-10-CM

## 2023-05-25 NOTE — Progress Notes (Signed)
[facility] [address] [place], [place] [address]  Internal MEDICINE  Office Visit Note  Patient Name: Luis Hendrix  [id number]  [id number]  Date of Service: 05/31/2023  Chief Complaint  Patient presents with   Annual Exam   Hypertension     HPI Pt is here for routine health maintenance examination and has no concerns today -UTD on colonoscopy -Planning on getting shingles vaccine -Bp at home stable, 125/85 -Working on diet, fluctuates some still. Exercising regularly -wearing cpap at night -labs ordered  Current Medication: Outpatient Encounter Medications as of 05/25/2023  Medication Sig   albuterol (VENTOLIN HFA) 108 (90 Base) MCG/ACT inhaler Inhale 1-2 puffs into the lungs every 6 (six) hours as needed for wheezing or shortness of breath.   budesonide-formoterol (SYMBICORT) 160-4.5 MCG/ACT inhaler Inhale 2 puffs into the lungs in the morning and at bedtime.   colchicine 0.6 MG tablet Take 1 tablet by mouth daily as needed for acute gout flare.   lisinopril (ZESTRIL) 20 MG tablet TAKE 1 TABLET BY MOUTH DAILY   topiramate (TOPAMAX) 25 MG tablet TAKE 1 TABLET BY MOUTH 2 TIMES A DAY   No facility-administered encounter medications on file as of 05/25/2023.    Surgical History: Past Surgical History:  Procedure Laterality Date   COLONOSCOPY WITH PROPOFOL N/A 02/26/2020   Procedure: COLONOSCOPY WITH PROPOFOL;  Surgeon: Toney Reil, MD;  Location: [facility] ENDOSCOPY;  Service: Gastroenterology;  Laterality: N/A;   DIAGNOSTIC LAPAROSCOPY     ELBOW FRACTURE SURGERY Right    Approx age 57   FRACTURE SURGERY     LAPAROSCOPIC GASTRIC SLEEVE RESECTION      Medical History: Past Medical History:  Diagnosis Date   Asthma    Hypertension    Sleep apnea     Family History: Family History  Problem Relation Age of Onset   Hypertension Father       Review of Systems  Constitutional:  Negative for chills, fatigue and unexpected weight  change.  HENT:  Negative for congestion, postnasal drip, rhinorrhea, sneezing and sore throat.   Eyes:  Negative for redness.  Respiratory:  Negative for cough, chest tightness and shortness of breath.   Cardiovascular:  Negative for chest pain and palpitations.  Gastrointestinal:  Negative for abdominal pain, constipation, diarrhea, nausea and vomiting.  Genitourinary:  Negative for dysuria and frequency.  Musculoskeletal:  Negative for arthralgias, back pain, joint swelling and neck pain.  Skin:  Negative for rash.  Neurological:  Negative for dizziness, tremors, light-headedness and numbness.  Hematological:  Negative for adenopathy. Does not bruise/bleed easily.  Psychiatric/Behavioral:  Negative for behavioral problems (Depression), sleep disturbance and suicidal ideas. The patient is not nervous/anxious.      Vital Signs: BP 112/88 Comment: 119/90  Pulse 71   Temp 98.1 F (36.7 C)   Resp 16   Ht 6\' 1"  (1.854 m)   Wt 274 lb 6.4 oz (124.5 kg)   SpO2 98%   BMI 36.20 kg/m    Physical Exam Vitals and nursing note reviewed.  Constitutional:      General: He is not in acute distress.    Appearance: Normal appearance. He is well-developed. He is obese. He is not diaphoretic.  HENT:     Head: Normocephalic and atraumatic.     Mouth/Throat:     Pharynx: No oropharyngeal exudate.  Eyes:     Pupils: Pupils are equal, round, and reactive to light.  Neck:     Thyroid: No thyromegaly.  Vascular: No JVD.     Trachea: No tracheal deviation.  Cardiovascular:     Rate and Rhythm: Normal rate and regular rhythm.     Heart sounds: Normal heart sounds. No murmur heard.    No friction rub. No gallop.  Pulmonary:     Effort: Pulmonary effort is normal. No respiratory distress.     Breath sounds: No wheezing or rales.  Chest:     Chest wall: No tenderness.  Abdominal:     General: Bowel sounds are normal.     Palpations: Abdomen is soft.  Musculoskeletal:        General:  Normal range of motion.     Cervical back: Normal range of motion and neck supple.     Right lower leg: No edema.     Left lower leg: No edema.  Lymphadenopathy:     Cervical: No cervical adenopathy.  Skin:    General: Skin is warm and dry.  Neurological:     Mental Status: He is alert and oriented to person, place, and time.     Cranial Nerves: No cranial nerve deficit.  Psychiatric:        Behavior: Behavior normal.        Thought Content: Thought content normal.        Judgment: Judgment normal.      LABS: Recent Results (from the past 2160 hour(s))  CBC with Differential/Platelet     Status: None   Collection Time: 05/26/23  8:08 AM  Result Value Ref Range   WBC 3.9 3.4 - 10.8 x10E3/uL   RBC 5.26 4.14 - 5.80 x10E6/uL   Hemoglobin 14.5 13.0 - 17.7 g/dL   Hematocrit 86.5 78.4 - 51.0 %   MCV 87 79 - 97 fL   MCH 27.6 26.6 - 33.0 pg   MCHC 31.9 31.5 - 35.7 g/dL   RDW 69.6 29.5 - 28.4 %   Platelets 212 150 - 450 x10E3/uL   Neutrophils 60 Not Estab. %   Lymphs 30 Not Estab. %   Monocytes 6 Not Estab. %   Eos 3 Not Estab. %   Basos 1 Not Estab. %   Neutrophils Absolute 2.3 1.4 - 7.0 x10E3/uL   Lymphocytes Absolute 1.2 0.7 - 3.1 x10E3/uL   Monocytes Absolute 0.3 0.1 - 0.9 x10E3/uL   EOS (ABSOLUTE) 0.1 0.0 - 0.4 x10E3/uL   Basophils Absolute 0.0 0.0 - 0.2 x10E3/uL   Immature Granulocytes 0 Not Estab. %   Immature Grans (Abs) 0.0 0.0 - 0.1 x10E3/uL  Comprehensive metabolic panel     Status: None   Collection Time: 05/26/23  8:08 AM  Result Value Ref Range   Glucose 90 70 - 99 mg/dL   BUN 16 6 - 24 mg/dL   Creatinine, Ser 1.32 0.76 - 1.27 mg/dL   eGFR 70 >44 WN/UUV/2.53   BUN/Creatinine Ratio 13 9 - 20   Sodium 137 134 - 144 mmol/L   Potassium 4.4 3.5 - 5.2 mmol/L   Chloride 102 96 - 106 mmol/L   CO2 25 20 - 29 mmol/L   Calcium 9.6 8.7 - 10.2 mg/dL   Total Protein 6.8 6.0 - 8.5 g/dL   Albumin 4.3 3.8 - 4.9 g/dL   Globulin, Total 2.5 1.5 - 4.5 g/dL   Bilirubin  Total 0.5 0.0 - 1.2 mg/dL   Alkaline Phosphatase 68 44 - 121 IU/L   AST 28 0 - 40 IU/L   ALT 17 0 - 44 IU/L  UA/M w/rflx Culture,  Comp     Status: None   Collection Time: 05/26/23  8:08 AM  Result Value Ref Range   Specific Gravity, UA 1.016 1.005 - 1.030   pH, UA 6.0 5.0 - 7.5   Color, UA Yellow Yellow   Appearance Ur Clear Clear   Leukocytes,UA Negative Negative   Protein,UA Negative Negative/Trace   Glucose, UA Negative Negative   Ketones, UA Negative Negative   RBC, UA Negative Negative   Bilirubin, UA Negative Negative   Urobilinogen, Ur 1.0 0.2 - 1.0 mg/dL   Nitrite, UA Negative Negative   Microscopic Examination Comment     Comment: Microscopic follows if indicated.   Microscopic Examination See below:     Comment: Microscopic was indicated and was performed.   Urinalysis Reflex Comment     Comment: This specimen will not reflex to a Urine Culture.  Microscopic Examination     Status: None   Collection Time: 05/26/23  8:08 AM  Result Value Ref Range   WBC, UA None seen 0 - 5 /hpf   RBC, Urine None seen 0 - 2 /hpf   Epithelial Cells (non renal) None seen 0 - 10 /hpf   Casts None seen None seen /lpf   Bacteria, UA None seen None seen/Few  Lipid Panel With LDL/HDL Ratio     Status: Abnormal   Collection Time: 05/26/23  8:08 AM  Result Value Ref Range   Cholesterol, Total 187 100 - 199 mg/dL   Triglycerides 46 0 - 149 mg/dL   HDL 64 >56 mg/dL   VLDL Cholesterol Cal 9 5 - 40 mg/dL   LDL Chol Calc (NIH) 387 (H) 0 - 99 mg/dL   LDL/HDL Ratio 1.8 0.0 - 3.6 ratio    Comment:                                     LDL/HDL Ratio                                             Men  Women                               1/2 Avg.Risk  1.0    1.5                                   Avg.Risk  3.6    3.2                                2X Avg.Risk  6.2    5.0                                3X Avg.Risk  8.0    6.1   PSA, total and free     Status: None   Collection Time: 05/26/23  8:08 AM   Result Value Ref Range   Prostate Specific Ag, Serum 0.7 0.0 - 4.0 ng/mL    Comment: Roche ECLIA methodology. According to the American Urological Association, Serum PSA should decrease and remain at  undetectable levels after radical prostatectomy. The AUA defines biochemical recurrence as an initial PSA value 0.2 ng/mL or greater followed by a subsequent confirmatory PSA value 0.2 ng/mL or greater. Values obtained with different assay methods or kits cannot be used interchangeably. Results cannot be interpreted as absolute evidence of the presence or absence of malignant disease.    PSA, Free 0.26 N/A ng/mL    Comment: Roche ECLIA methodology.   PSA, Free Pct 37.1 %    Comment: The table below lists the probability of prostate cancer for men with non-suspicious DRE results and total PSA between 4 and 10 ng/mL, by patient age Damaris Schooner, JAMA 1998, 161:0960).                   % Free PSA       50-64 yr        65-75 yr                   0.00-10.00%        56%             55%                  10.01-15.00%        24%             35%                  15.01-20.00%        17%             23%                  20.01-25.00%        10%             20%                       >25.00%         5%              9% Please note:  Catalona et al did not make specific               recommendations regarding the use of               percent free PSA for any other population               of men.   B12 and Folate Panel     Status: None   Collection Time: 05/26/23  8:08 AM  Result Value Ref Range   Vitamin B-12 802 232 - 1,245 pg/mL   Folate >20.0 >3.0 ng/mL    Comment: A serum folate concentration of less than 3.1 ng/mL is considered to represent clinical deficiency.   T4, free     Status: None   Collection Time: 05/26/23  8:08 AM  Result Value Ref Range   Free T4 1.25 0.82 - 1.77 ng/dL  TSH     Status: None   Collection Time: 05/26/23  8:08 AM  Result Value Ref Range   TSH 1.570 0.450 -  4.500 uIU/mL  VITAMIN D 25 Hydroxy (Vit-D Deficiency, Fractures)     Status: None   Collection Time: 05/26/23  8:08 AM  Result Value Ref Range   Vit D, 25-Hydroxy 30.4 30.0 - 100.0 ng/mL    Comment: Vitamin D deficiency has been defined by the Institute of Medicine and an Endocrine Society practice  guideline as a level of serum 25-OH vitamin D less than 20 ng/mL (1,2). The Endocrine Society went on to further define vitamin D insufficiency as a level between 21 and 29 ng/mL (2). 1. IOM (Institute of Medicine). 2010. Dietary reference    intakes for calcium and D. [place]: The    [employer]. 2. Holick MF, Binkley St. [place], Bischoff-Ferrari HA, et al.    Evaluation, treatment, and prevention of vitamin D    deficiency: an Endocrine Society clinical practice    guideline. JCEM. 2011 Jul; 96(7):1911-30.         Assessment/Plan: 1. Encounter for general adult medical examination with abnormal findings CPE performed, labs ordered. UTD on PHM--will schedule shingles vaccines   General Counseling: terius zasada understanding of the findings of todays visit and agrees with plan of treatment. I have discussed any further diagnostic evaluation that may be needed or ordered today. We also reviewed his medications today. he has been encouraged to call the office with any questions or concerns that should arise related to todays visit.    Counseling:    No orders of the defined types were placed in this encounter.   No orders of the defined types were placed in this encounter.   This patient was seen by Lynn Ito, PA-C in collaboration with Dr. Beverely Risen as a part of collaborative care agreement.  Total time spent:35 Minutes  Time spent includes review of chart, medications, test results, and follow up plan with the patient.     Lyndon Code, MD  Internal Medicine

## 2023-05-26 ENCOUNTER — Other Ambulatory Visit: Payer: Self-pay | Admitting: Physician Assistant

## 2023-05-27 LAB — UA/M W/RFLX CULTURE, COMP
Bilirubin, UA: NEGATIVE
Glucose, UA: NEGATIVE
Ketones, UA: NEGATIVE
Leukocytes,UA: NEGATIVE
Nitrite, UA: NEGATIVE
Protein,UA: NEGATIVE
RBC, UA: NEGATIVE
Specific Gravity, UA: 1.016 (ref 1.005–1.030)
Urobilinogen, Ur: 1 mg/dL (ref 0.2–1.0)
pH, UA: 6 (ref 5.0–7.5)

## 2023-05-27 LAB — MICROSCOPIC EXAMINATION
Bacteria, UA: NONE SEEN
Casts: NONE SEEN /[LPF]
Epithelial Cells (non renal): NONE SEEN /[HPF] (ref 0–10)
RBC, Urine: NONE SEEN /[HPF] (ref 0–2)
WBC, UA: NONE SEEN /[HPF] (ref 0–5)

## 2023-05-27 LAB — CBC WITH DIFFERENTIAL/PLATELET
Basophils Absolute: 0 10*3/uL (ref 0.0–0.2)
Basos: 1 %
EOS (ABSOLUTE): 0.1 10*3/uL (ref 0.0–0.4)
Eos: 3 %
Hematocrit: 45.5 % (ref 37.5–51.0)
Hemoglobin: 14.5 g/dL (ref 13.0–17.7)
Immature Grans (Abs): 0 10*3/uL (ref 0.0–0.1)
Immature Granulocytes: 0 %
Lymphocytes Absolute: 1.2 10*3/uL (ref 0.7–3.1)
Lymphs: 30 %
MCH: 27.6 pg (ref 26.6–33.0)
MCHC: 31.9 g/dL (ref 31.5–35.7)
MCV: 87 fL (ref 79–97)
Monocytes Absolute: 0.3 10*3/uL (ref 0.1–0.9)
Monocytes: 6 %
Neutrophils Absolute: 2.3 10*3/uL (ref 1.4–7.0)
Neutrophils: 60 %
Platelets: 212 10*3/uL (ref 150–450)
RBC: 5.26 x10E6/uL (ref 4.14–5.80)
RDW: 12.4 % (ref 11.6–15.4)
WBC: 3.9 10*3/uL (ref 3.4–10.8)

## 2023-05-27 LAB — B12 AND FOLATE PANEL
Folate: >20 ng/mL (ref >3.0)
Vitamin B-12: 802 pg/mL (ref 232–1245)

## 2023-05-27 LAB — T4, FREE: Free T4: 1.25 ng/dL (ref 0.82–1.77)

## 2023-05-27 LAB — COMPREHENSIVE METABOLIC PANEL
ALT: 17 [IU]/L (ref 0–44)
AST: 28 [IU]/L (ref 0–40)
Albumin: 4.3 g/dL (ref 3.8–4.9)
Alkaline Phosphatase: 68 [IU]/L (ref 44–121)
BUN/Creatinine Ratio: 13 (ref 9–20)
BUN: 16 mg/dL (ref 6–24)
Bilirubin Total: 0.5 mg/dL (ref 0.0–1.2)
CO2: 25 mmol/L (ref 20–29)
Calcium: 9.6 mg/dL (ref 8.7–10.2)
Chloride: 102 mmol/L (ref 96–106)
Creatinine, Ser: 1.23 mg/dL (ref 0.76–1.27)
Globulin, Total: 2.5 g/dL (ref 1.5–4.5)
Glucose: 90 mg/dL (ref 70–99)
Potassium: 4.4 mmol/L (ref 3.5–5.2)
Sodium: 137 mmol/L (ref 134–144)
Total Protein: 6.8 g/dL (ref 6.0–8.5)
eGFR: 70 mL/min/{1.73_m2} (ref >59)

## 2023-05-27 LAB — PSA, TOTAL AND FREE
PSA, Free Pct: 37.1 %
PSA, Free: 0.26 ng/mL
Prostate Specific Ag, Serum: 0.7 ng/mL (ref 0.0–4.0)

## 2023-05-27 LAB — VITAMIN D 25 HYDROXY (VIT D DEFICIENCY, FRACTURES): Vit D, 25-Hydroxy: 30.4 ng/mL (ref 30.0–100.0)

## 2023-05-27 LAB — LIPID PANEL WITH LDL/HDL RATIO
Cholesterol, Total: 187 mg/dL (ref 100–199)
HDL: 64 mg/dL (ref >39)
LDL Chol Calc (NIH): 114 mg/dL — ABNORMAL HIGH (ref 0–99)
LDL/HDL Ratio: 1.8 ratio (ref 0.0–3.6)
Triglycerides: 46 mg/dL (ref 0–149)
VLDL Cholesterol Cal: 9 mg/dL (ref 5–40)

## 2023-05-27 LAB — TSH: TSH: 1.57 u[IU]/mL (ref 0.450–4.500)

## 2023-06-01 ENCOUNTER — Other Ambulatory Visit: Payer: Self-pay

## 2023-06-01 ENCOUNTER — Telehealth: Payer: Self-pay

## 2023-06-01 NOTE — Telephone Encounter (Signed)
-----   Message from Carlean Jews sent at 05/30/2023  1:30 PM EST ----- Please let him know that his cholesterol went up slightly and should try to work on this. We had discussed considering a lower intensity statin and can sent pravastatin if he would like to try this, otherwise work on diet/exercise. Also vit D borderline low and recommend OTC supplement

## 2023-06-01 NOTE — Telephone Encounter (Signed)
Spoke with patient regarding lab results. 

## 2023-06-02 LAB — PULMONARY FUNCTION TEST

## 2023-07-14 ENCOUNTER — Other Ambulatory Visit: Payer: Self-pay | Admitting: Physician Assistant

## 2023-07-14 DIAGNOSIS — I10 Essential (primary) hypertension: Secondary | ICD-10-CM

## 2023-07-27 ENCOUNTER — Encounter: Payer: Self-pay | Admitting: Physician Assistant

## 2023-07-27 ENCOUNTER — Ambulatory Visit (INDEPENDENT_AMBULATORY_CARE_PROVIDER_SITE_OTHER): Payer: 59 | Admitting: Physician Assistant

## 2023-07-27 VITALS — BP 105/80 | HR 83 | Temp 98.3°F | Resp 16 | Ht 73.0 in | Wt 278.4 lb

## 2023-07-27 DIAGNOSIS — I10 Essential (primary) hypertension: Secondary | ICD-10-CM | POA: Diagnosis not present

## 2023-07-27 DIAGNOSIS — E785 Hyperlipidemia, unspecified: Secondary | ICD-10-CM

## 2023-07-27 DIAGNOSIS — E669 Obesity, unspecified: Secondary | ICD-10-CM | POA: Diagnosis not present

## 2023-07-27 MED ORDER — ROSUVASTATIN CALCIUM 5 MG PO TABS: ORAL_TABLET | ORAL | 1 refills | Status: AC

## 2023-07-27 NOTE — Progress Notes (Signed)
 [facility] [address] [place], [place] [address]  Internal MEDICINE  Office Visit Note  Patient Name: Luis Hendrix  [id number]  [id number]  Date of Service: 07/27/2023  Chief Complaint  Patient presents with   Follow-up    Labs    Hypertension   Asthma    HPI Pt is here for routine follow up -BP stable, a little low in office but denies symptoms and will monitor. May be able to decrease lisinopril  -Diet interrupted with holidays, has set goals today seeing wt rising -Goal to really get back in routine and improve diet, still exercising -Labs previously reviewed: vit D borderline low, cholesterol elevated a little more again. Previously had muscle aches with crestor , was taking daily at the time. Open to trying 2x per week.  -Taking vit D and feeling better  Current Medication: Outpatient Encounter Medications as of 07/27/2023  Medication Sig   albuterol  (VENTOLIN  HFA) 108 (90 Base) MCG/ACT inhaler Inhale 1-2 puffs into the lungs every 6 (six) hours as needed for wheezing or shortness of breath.   budesonide -formoterol  (SYMBICORT ) 160-4.5 MCG/ACT inhaler Inhale 2 puffs into the lungs in the morning and at bedtime.   colchicine  0.6 MG tablet Take 1 tablet by mouth daily as needed for acute gout flare.   lisinopril  (ZESTRIL ) 20 MG tablet TAKE 1 TABLET BY MOUTH DAILY   rosuvastatin  (CRESTOR ) 5 MG tablet Take 1 tablet by mouth twice per week.   topiramate  (TOPAMAX ) 25 MG tablet TAKE 1 TABLET BY MOUTH 2 TIMES A DAY   No facility-administered encounter medications on file as of 07/27/2023.    Surgical History: Past Surgical History:  Procedure Laterality Date   COLONOSCOPY WITH PROPOFOL  N/A 02/26/2020   Procedure: COLONOSCOPY WITH PROPOFOL ;  Surgeon: Unk Corinn Skiff, MD;  Location: [facility] ENDOSCOPY;  Service: Gastroenterology;  Laterality: N/A;   DIAGNOSTIC LAPAROSCOPY     ELBOW FRACTURE SURGERY Right    Approx age 62   FRACTURE SURGERY     LAPAROSCOPIC  GASTRIC SLEEVE RESECTION      Medical History: Past Medical History:  Diagnosis Date   Asthma    Hypertension    Sleep apnea     Family History: Family History  Problem Relation Age of Onset   Hypertension Father     Social History   Socioeconomic History   Marital status: Married    Spouse name: Not on file   Number of children: Not on file   Years of education: Not on file   Highest education level: Not on file  Occupational History   Not on file  Tobacco Use   Smoking status: Never   Smokeless tobacco: Never  Vaping Use   Vaping status: Never Used  Substance and Sexual Activity   Alcohol use: Yes    Comment: OCCASIONALLY   Drug use: No   Sexual activity: Yes    Partners: Female  Other Topics Concern   Not on file  Social History Narrative   Not on file   Social Drivers of Health   Financial Resource Strain: Not on file  Food Insecurity: Not on file  Transportation Needs: Not on file  Physical Activity: Not on file  Stress: Not on file  Social Connections: Not on file  Intimate Partner Violence: Not on file      Review of Systems  Constitutional:  Negative for chills, fatigue and unexpected weight change.  HENT:  Negative for congestion, postnasal drip, rhinorrhea, sneezing and sore throat.  Eyes:  Negative for redness.  Respiratory:  Negative for cough, chest tightness and shortness of breath.   Cardiovascular:  Negative for chest pain and palpitations.  Gastrointestinal:  Negative for abdominal pain, constipation, diarrhea, nausea and vomiting.  Genitourinary:  Negative for dysuria and frequency.  Musculoskeletal:  Negative for arthralgias, back pain, joint swelling and neck pain.  Skin:  Negative for rash.  Neurological:  Negative for dizziness, tremors, light-headedness and numbness.  Hematological:  Negative for adenopathy. Does not bruise/bleed easily.  Psychiatric/Behavioral:  Negative for behavioral problems (Depression), sleep  disturbance and suicidal ideas. The patient is not nervous/anxious.     Vital Signs: BP 105/80   Pulse 83   Temp 98.3 F (36.8 C)   Resp 16   Ht 6' 1 (1.854 m)   Wt 278 lb 6.4 oz (126.3 kg)   SpO2 97%   BMI 36.73 kg/m    Physical Exam Vitals and nursing note reviewed.  Constitutional:      General: He is not in acute distress.    Appearance: Normal appearance. He is well-developed. He is obese. He is not diaphoretic.  HENT:     Head: Normocephalic and atraumatic.     Mouth/Throat:     Pharynx: No oropharyngeal exudate.  Eyes:     Pupils: Pupils are equal, round, and reactive to light.  Neck:     Thyroid: No thyromegaly.     Vascular: No JVD.     Trachea: No tracheal deviation.  Cardiovascular:     Rate and Rhythm: Normal rate and regular rhythm.     Heart sounds: Normal heart sounds. No murmur heard.    No friction rub. No gallop.  Pulmonary:     Effort: Pulmonary effort is normal. No respiratory distress.     Breath sounds: No wheezing or rales.  Chest:     Chest wall: No tenderness.  Abdominal:     General: Bowel sounds are normal.     Palpations: Abdomen is soft.  Musculoskeletal:        General: Normal range of motion.     Cervical back: Normal range of motion and neck supple.     Right lower leg: No edema.     Left lower leg: No edema.  Lymphadenopathy:     Cervical: No cervical adenopathy.  Skin:    General: Skin is warm and dry.  Neurological:     Mental Status: He is alert and oriented to person, place, and time.     Cranial Nerves: No cranial nerve deficit.  Psychiatric:        Behavior: Behavior normal.        Thought Content: Thought content normal.        Judgment: Judgment normal.        Assessment/Plan: 1. Essential hypertension (Primary) Stable, may consider lower lisinopril  if dropping low. Denies any dizziness and will monitor.  2. Hyperlipidemia LDL goal <100 Will restart crestor  2x per week and really work on diet and exercise  routine - rosuvastatin  (CRESTOR ) 5 MG tablet; Take 1 tablet by mouth twice per week.  Dispense: 15 tablet; Refill: 1  3. Obesity (BMI 30-39.9) Up 4lbs since last visit and has set goals to get back on track with diet in addition to exercise.    General Counseling: muadh creasy understanding of the findings of todays visit and agrees with plan of treatment. I have discussed any further diagnostic evaluation that may be needed or ordered today. We also reviewed his  medications today. he has been encouraged to call the office with any questions or concerns that should arise related to todays visit.    No orders of the defined types were placed in this encounter.   Meds ordered this encounter  Medications   rosuvastatin  (CRESTOR ) 5 MG tablet    Sig: Take 1 tablet by mouth twice per week.    Dispense:  15 tablet    Refill:  1    This patient was seen by Tinnie Pro, PA-C in collaboration with Dr. Sigrid Bathe as a part of collaborative care agreement.   Total time spent:30 Minutes Time spent includes review of chart, medications, test results, and follow up plan with the patient.      Dr Fozia M Khan Internal medicine

## 2023-09-05 ENCOUNTER — Other Ambulatory Visit: Payer: Self-pay | Admitting: Physician Assistant

## 2023-10-26 ENCOUNTER — Ambulatory Visit (INDEPENDENT_AMBULATORY_CARE_PROVIDER_SITE_OTHER): Payer: 59 | Admitting: Physician Assistant

## 2023-10-26 VITALS — BP 132/88 | HR 75 | Temp 97.8°F | Resp 16 | Ht 73.0 in | Wt 278.0 lb

## 2023-10-26 DIAGNOSIS — E785 Hyperlipidemia, unspecified: Secondary | ICD-10-CM

## 2023-10-26 DIAGNOSIS — E669 Obesity, unspecified: Secondary | ICD-10-CM | POA: Diagnosis not present

## 2023-10-26 DIAGNOSIS — I10 Essential (primary) hypertension: Secondary | ICD-10-CM

## 2023-10-26 NOTE — Progress Notes (Signed)
 [facility] [address] [place], [place] [address]  Internal MEDICINE  Office Visit Note  Patient Name: Luis Hendrix  [id number]  [id number]  Date of Service: 10/26/2023  Chief Complaint  Patient presents with   Follow-up   Hypertension   Asthma    HPI Pt is here for routine follow up -Breathing has been doing ok despite the pollen -BP stable, at home 130s/80s -exercising well, may start back on protein shakes to avoid snacking as much. Weight stable from last visit, but still trying to work on weight loss goals -tolerating crestor 2x/week, no joint problems this time  Current Medication: Outpatient Encounter Medications as of 10/26/2023  Medication Sig   albuterol (VENTOLIN HFA) 108 (90 Base) MCG/ACT inhaler Inhale 1-2 puffs into the lungs every 6 (six) hours as needed for wheezing or shortness of breath.   budesonide-formoterol (SYMBICORT) 160-4.5 MCG/ACT inhaler Inhale 2 puffs into the lungs in the morning and at bedtime.   colchicine 0.6 MG tablet TAKE 1 TABLET BY MOUTH DAILY AS NEEDED FOR ACUTE GOUT FLARE   lisinopril (ZESTRIL) 20 MG tablet TAKE 1 TABLET BY MOUTH DAILY   rosuvastatin (CRESTOR) 5 MG tablet Take 1 tablet by mouth twice per week.   topiramate (TOPAMAX) 25 MG tablet TAKE 1 TABLET BY MOUTH 2 TIMES A DAY   No facility-administered encounter medications on file as of 10/26/2023.    Surgical History: Past Surgical History:  Procedure Laterality Date   COLONOSCOPY WITH PROPOFOL N/A 02/26/2020   Procedure: COLONOSCOPY WITH PROPOFOL;  Surgeon: Toney Reil, MD;  Location: [facility] ENDOSCOPY;  Service: Gastroenterology;  Laterality: N/A;   DIAGNOSTIC LAPAROSCOPY     ELBOW FRACTURE SURGERY Right    Approx age 72   FRACTURE SURGERY     LAPAROSCOPIC GASTRIC SLEEVE RESECTION      Medical History: Past Medical History:  Diagnosis Date   Asthma    Hypertension    Sleep apnea     Family History: Family History  Problem Relation Age of  Onset   Hypertension Father     Social History   Socioeconomic History   Marital status: Married    Spouse name: Not on file   Number of children: Not on file   Years of education: Not on file   Highest education level: Not on file  Occupational History   Not on file  Tobacco Use   Smoking status: Never   Smokeless tobacco: Never  Vaping Use   Vaping status: Never Used  Substance and Sexual Activity   Alcohol use: Yes    Comment: OCCASIONALLY   Drug use: No   Sexual activity: Yes    Partners: Female  Other Topics Concern   Not on file  Social History Narrative   Not on file   Social Drivers of Health   Financial Resource Strain: Not on file  Food Insecurity: Not on file  Transportation Needs: Not on file  Physical Activity: Not on file  Stress: Not on file  Social Connections: Not on file  Intimate Partner Violence: Not on file      Review of Systems  Constitutional:  Negative for chills, fatigue and unexpected weight change.  HENT:  Negative for congestion, postnasal drip, rhinorrhea, sneezing and sore throat.   Eyes:  Negative for redness.  Respiratory:  Negative for cough, chest tightness and shortness of breath.   Cardiovascular:  Negative for chest pain and palpitations.  Gastrointestinal:  Negative for abdominal pain, constipation, diarrhea,  nausea and vomiting.  Genitourinary:  Negative for dysuria and frequency.  Musculoskeletal:  Negative for arthralgias, back pain, joint swelling and neck pain.  Skin:  Negative for rash.  Neurological:  Negative for dizziness, tremors, light-headedness and numbness.  Hematological:  Negative for adenopathy. Does not bruise/bleed easily.  Psychiatric/Behavioral:  Negative for behavioral problems (Depression), sleep disturbance and suicidal ideas. The patient is not nervous/anxious.     Vital Signs: BP 132/88 Comment: 130/90  Pulse 75   Temp 97.8 F (36.6 C)   Resp 16   Ht 6\' 1"  (1.854 m)   Wt 278 lb (126.1 kg)    SpO2 95%   BMI 36.68 kg/m    Physical Exam Vitals and nursing note reviewed.  Constitutional:      General: He is not in acute distress.    Appearance: Normal appearance. He is well-developed. He is obese. He is not diaphoretic.  HENT:     Head: Normocephalic and atraumatic.  Neck:     Thyroid: No thyromegaly.     Vascular: No JVD.     Trachea: No tracheal deviation.  Cardiovascular:     Rate and Rhythm: Normal rate and regular rhythm.     Heart sounds: Normal heart sounds.  Pulmonary:     Effort: Pulmonary effort is normal. No respiratory distress.     Breath sounds: No wheezing or rales.  Musculoskeletal:        General: Normal range of motion.  Skin:    General: Skin is warm and dry.  Neurological:     Mental Status: He is alert and oriented to person, place, and time.  Psychiatric:        Behavior: Behavior normal.        Thought Content: Thought content normal.        Judgment: Judgment normal.        Assessment/Plan: 1. Essential hypertension (Primary) Bp stable, continue current medication  2. Hyperlipidemia LDL goal <100 Tolerating crestor 2x/week and will continue   3. Obesity (BMI 30-39.9) Continuing to work on diet and exercise   General Counseling: tyliek timberman understanding of the findings of todays visit and agrees with plan of treatment. I have discussed any further diagnostic evaluation that may be needed or ordered today. We also reviewed his medications today. he has been encouraged to call the office with any questions or concerns that should arise related to todays visit.    No orders of the defined types were placed in this encounter.   No orders of the defined types were placed in this encounter.   This patient was seen by Lynn Ito, PA-C in collaboration with Dr. Beverely Risen as a part of collaborative care agreement.   Total time spent:30 Minutes Time spent includes review of chart, medications, test results, and  follow up plan with the patient.      Dr Lyndon Code Internal medicine

## 2024-01-25 ENCOUNTER — Ambulatory Visit (INDEPENDENT_AMBULATORY_CARE_PROVIDER_SITE_OTHER): Admitting: Physician Assistant

## 2024-01-25 ENCOUNTER — Encounter: Payer: Self-pay | Admitting: Physician Assistant

## 2024-01-25 VITALS — BP 130/80 | HR 64 | Temp 97.8°F | Resp 16 | Ht 73.0 in | Wt 279.0 lb

## 2024-01-25 DIAGNOSIS — I10 Essential (primary) hypertension: Secondary | ICD-10-CM | POA: Diagnosis not present

## 2024-01-25 DIAGNOSIS — Z125 Encounter for screening for malignant neoplasm of prostate: Secondary | ICD-10-CM

## 2024-01-25 DIAGNOSIS — E669 Obesity, unspecified: Secondary | ICD-10-CM

## 2024-01-25 DIAGNOSIS — R5383 Other fatigue: Secondary | ICD-10-CM | POA: Diagnosis not present

## 2024-01-25 DIAGNOSIS — E559 Vitamin D deficiency, unspecified: Secondary | ICD-10-CM

## 2024-01-25 DIAGNOSIS — E785 Hyperlipidemia, unspecified: Secondary | ICD-10-CM

## 2024-01-25 DIAGNOSIS — R7989 Other specified abnormal findings of blood chemistry: Secondary | ICD-10-CM

## 2024-01-25 NOTE — Progress Notes (Signed)
 [facility] [address] [place], [place] [address]  Internal MEDICINE  Office Visit Note  Patient Name: Luis Hendrix  [id number]  [id number]  Date of Service: 01/25/2024  Chief Complaint  Patient presents with   Follow-up   Hypertension    HPI Pt is here for routine follow up -doing well today, no complaints -BP stable -breathing stable -exercising regularly -taking crestor  a few times per week, tolerating well now -went to [place] for all inclusive vacation so diet wasn't maintained as well then, but doing better now that he is back. Avoiding late night snacks  Current Medication: Outpatient Encounter Medications as of 01/25/2024  Medication Sig   albuterol  (VENTOLIN  HFA) 108 (90 Base) MCG/ACT inhaler Inhale 1-2 puffs into the lungs every 6 (six) hours as needed for wheezing or shortness of breath.   budesonide -formoterol  (SYMBICORT ) 160-4.5 MCG/ACT inhaler Inhale 2 puffs into the lungs in the morning and at bedtime.   colchicine  0.6 MG tablet TAKE 1 TABLET BY MOUTH DAILY AS NEEDED FOR ACUTE GOUT FLARE   lisinopril  (ZESTRIL ) 20 MG tablet TAKE 1 TABLET BY MOUTH DAILY   rosuvastatin  (CRESTOR ) 5 MG tablet Take 1 tablet by mouth twice per week.   topiramate  (TOPAMAX ) 25 MG tablet TAKE 1 TABLET BY MOUTH 2 TIMES A DAY   No facility-administered encounter medications on file as of 01/25/2024.    Surgical History: Past Surgical History:  Procedure Laterality Date   COLONOSCOPY WITH PROPOFOL  N/A 02/26/2020   Procedure: COLONOSCOPY WITH PROPOFOL ;  Surgeon: Unk Corinn Skiff, MD;  Location: [facility] ENDOSCOPY;  Service: Gastroenterology;  Laterality: N/A;   DIAGNOSTIC LAPAROSCOPY     ELBOW FRACTURE SURGERY Right    Approx age 70   FRACTURE SURGERY     LAPAROSCOPIC GASTRIC SLEEVE RESECTION      Medical History: Past Medical History:  Diagnosis Date   Asthma    Hypertension    Sleep apnea     Family History: Family History  Problem Relation Age of Onset    Hypertension Father     Social History   Socioeconomic History   Marital status: Married    Spouse name: Not on file   Number of children: Not on file   Years of education: Not on file   Highest education level: Not on file  Occupational History   Not on file  Tobacco Use   Smoking status: Never   Smokeless tobacco: Never  Vaping Use   Vaping status: Never Used  Substance and Sexual Activity   Alcohol use: Yes    Comment: OCCASIONALLY   Drug use: No   Sexual activity: Yes    Partners: Female  Other Topics Concern   Not on file  Social History Narrative   Not on file   Social Drivers of Health   Financial Resource Strain: Not on file  Food Insecurity: Not on file  Transportation Needs: Not on file  Physical Activity: Not on file  Stress: Not on file  Social Connections: Not on file  Intimate Partner Violence: Not on file      Review of Systems  Constitutional:  Negative for chills, fatigue and unexpected weight change.  HENT:  Negative for congestion, postnasal drip, rhinorrhea, sneezing and sore throat.   Eyes:  Negative for redness.  Respiratory:  Negative for cough, chest tightness and shortness of breath.   Cardiovascular:  Negative for chest pain and palpitations.  Gastrointestinal:  Negative for abdominal pain, constipation, diarrhea, nausea and vomiting.  Genitourinary:  Negative for dysuria and frequency.  Musculoskeletal:  Negative for arthralgias, back pain, joint swelling and neck pain.  Skin:  Negative for rash.  Neurological:  Negative for dizziness, tremors, light-headedness and numbness.  Hematological:  Negative for adenopathy. Does not bruise/bleed easily.  Psychiatric/Behavioral:  Negative for behavioral problems (Depression), sleep disturbance and suicidal ideas. The patient is not nervous/anxious.     Vital Signs: BP 130/80   Pulse 64   Temp 97.8 F (36.6 C)   Resp 16   Ht 6' 1 (1.854 m)   Wt 279 lb (126.6 kg)   SpO2 98%   BMI  36.81 kg/m    Physical Exam Vitals and nursing note reviewed.  Constitutional:      General: He is not in acute distress.    Appearance: Normal appearance. He is well-developed. He is obese. He is not diaphoretic.  HENT:     Head: Normocephalic and atraumatic.  Neck:     Thyroid: No thyromegaly.     Vascular: No JVD.     Trachea: No tracheal deviation.  Cardiovascular:     Rate and Rhythm: Normal rate and regular rhythm.     Heart sounds: Normal heart sounds.  Pulmonary:     Effort: Pulmonary effort is normal. No respiratory distress.     Breath sounds: No wheezing or rales.  Musculoskeletal:        General: Normal range of motion.  Skin:    General: Skin is warm and dry.  Neurological:     Mental Status: He is alert and oriented to person, place, and time.  Psychiatric:        Behavior: Behavior normal.        Thought Content: Thought content normal.        Judgment: Judgment normal.        Assessment/Plan: 1. Essential hypertension (Primary) Stable, continue current medication  2. Hyperlipidemia LDL goal <100 Tolerating crestor  a few times per week and will update labs - Lipid Panel With LDL/HDL Ratio  3. Other fatigue - CBC w/Diff/Platelet - Comprehensive metabolic panel with GFR - TSH + free T4 - Lipid Panel With LDL/HDL Ratio - VITAMIN D  25 Hydroxy (Vit-D Deficiency, Fractures) - PSA Total (Reflex To Free)  4. Abnormal thyroid blood test - TSH + free T4  5. Vitamin D  deficiency - VITAMIN D  25 Hydroxy (Vit-D Deficiency, Fractures)  6. Special screening for malignant neoplasm of prostate - PSA Total (Reflex To Free)  7. Obesity (BMI 30-39.9) Continue to work on diet and exercise to improve weight loss goals   General Counseling: Silvester verbalizes understanding of the findings of todays visit and agrees with plan of treatment. I have discussed any further diagnostic evaluation that may be needed or ordered today. We also reviewed his medications  today. he has been encouraged to call the office with any questions or concerns that should arise related to todays visit.    Orders Placed This Encounter  Procedures   CBC w/Diff/Platelet   Comprehensive metabolic panel with GFR   TSH + free T4   Lipid Panel With LDL/HDL Ratio   VITAMIN D  25 Hydroxy (Vit-D Deficiency, Fractures)   PSA Total (Reflex To Free)    No orders of the defined types were placed in this encounter.   This patient was seen by Tinnie Pro, PA-C in collaboration with Dr. Sigrid Bathe as a part of collaborative care agreement.   Total time spent:30 Minutes Time spent includes review of chart,  medications, test results, and follow up plan with the patient.      Dr Fozia M Khan Internal medicine

## 2024-03-07 ENCOUNTER — Other Ambulatory Visit: Payer: Self-pay

## 2024-03-07 DIAGNOSIS — R0602 Shortness of breath: Secondary | ICD-10-CM

## 2024-03-14 ENCOUNTER — Other Ambulatory Visit: Payer: Self-pay | Admitting: Physician Assistant

## 2024-03-14 DIAGNOSIS — I10 Essential (primary) hypertension: Secondary | ICD-10-CM

## 2024-03-20 ENCOUNTER — Ambulatory Visit: Admitting: Internal Medicine

## 2024-03-20 DIAGNOSIS — R0602 Shortness of breath: Secondary | ICD-10-CM | POA: Diagnosis not present

## 2024-03-23 LAB — CBC WITH DIFFERENTIAL/PLATELET
Basophils Absolute: 0 x10E3/uL (ref 0.0–0.2)
Basos: 1 %
EOS (ABSOLUTE): 0.1 x10E3/uL (ref 0.0–0.4)
Eos: 3 %
Hematocrit: 47.3 % (ref 37.5–51.0)
Hemoglobin: 14.5 g/dL (ref 13.0–17.7)
Immature Grans (Abs): 0 x10E3/uL (ref 0.0–0.1)
Immature Granulocytes: 0 %
Lymphocytes Absolute: 1.7 x10E3/uL (ref 0.7–3.1)
Lymphs: 39 %
MCH: 27.1 pg (ref 26.6–33.0)
MCHC: 30.7 g/dL — ABNORMAL LOW (ref 31.5–35.7)
MCV: 88 fL (ref 79–97)
Monocytes Absolute: 0.3 x10E3/uL (ref 0.1–0.9)
Monocytes: 7 %
Neutrophils Absolute: 2.2 x10E3/uL (ref 1.4–7.0)
Neutrophils: 50 %
Platelets: 208 x10E3/uL (ref 150–450)
RBC: 5.35 x10E6/uL (ref 4.14–5.80)
RDW: 12.7 % (ref 11.6–15.4)
WBC: 4.3 x10E3/uL (ref 3.4–10.8)

## 2024-03-23 LAB — LIPID PANEL WITH LDL/HDL RATIO
Cholesterol, Total: 156 mg/dL (ref 100–199)
HDL: 69 mg/dL (ref >39)
LDL Chol Calc (NIH): 76 mg/dL (ref 0–99)
LDL/HDL Ratio: 1.1 ratio (ref 0.0–3.6)
Triglycerides: 52 mg/dL (ref 0–149)
VLDL Cholesterol Cal: 11 mg/dL (ref 5–40)

## 2024-03-23 LAB — COMPREHENSIVE METABOLIC PANEL WITH GFR
ALT: 28 [IU]/L (ref 0–44)
AST: 31 [IU]/L (ref 0–40)
Albumin: 4.6 g/dL (ref 3.8–4.9)
Alkaline Phosphatase: 67 [IU]/L (ref 44–121)
BUN/Creatinine Ratio: 11 (ref 9–20)
BUN: 13 mg/dL (ref 6–24)
Bilirubin Total: 0.6 mg/dL (ref 0.0–1.2)
CO2: 21 mmol/L (ref 20–29)
Calcium: 9.9 mg/dL (ref 8.7–10.2)
Chloride: 102 mmol/L (ref 96–106)
Creatinine, Ser: 1.21 mg/dL (ref 0.76–1.27)
Globulin, Total: 2.4 g/dL (ref 1.5–4.5)
Glucose: 81 mg/dL (ref 70–99)
Potassium: 4.6 mmol/L (ref 3.5–5.2)
Sodium: 139 mmol/L (ref 134–144)
Total Protein: 7 g/dL (ref 6.0–8.5)
eGFR: 71 mL/min/{1.73_m2}

## 2024-03-23 LAB — TSH+FREE T4
Free T4: 1.18 ng/dL (ref 0.82–1.77)
TSH: 2.08 u[IU]/mL (ref 0.450–4.500)

## 2024-03-23 LAB — VITAMIN D 25 HYDROXY (VIT D DEFICIENCY, FRACTURES): Vit D, 25-Hydroxy: 33.7 ng/mL (ref 30.0–100.0)

## 2024-03-23 LAB — PSA TOTAL (REFLEX TO FREE): Prostate Specific Ag, Serum: 0.8 ng/mL (ref 0.0–4.0)

## 2024-03-24 NOTE — Procedures (Signed)
 [facility] [address] [place], [address]    Complete Pulmonary Function Testing Interpretation:  FINDINGS:  The forced vital capacity is normal.  The FEV1 was normal.  Postbronchodilator there was no significant change in the FEV1.  FEV1 FVC ratio was mildly decreased.  Total lung capacity is mildly decreased.  Residual volume is decreased FRC is decreased DLCO was increased.  IMPRESSION:  This pulmonary function study is consistent with mild restrictive lung disease clinical correlation is recommended.  Luis DELENA Bathe, MD [facility] Pulmonary Critical Care Medicine Sleep Medicine

## 2024-03-26 ENCOUNTER — Ambulatory Visit: Payer: Self-pay | Admitting: Physician Assistant

## 2024-03-27 LAB — PULMONARY FUNCTION TEST

## 2024-04-03 ENCOUNTER — Encounter: Payer: 59 | Admitting: Internal Medicine

## 2024-04-08 ENCOUNTER — Ambulatory Visit: Admitting: Internal Medicine

## 2024-04-11 ENCOUNTER — Ambulatory Visit: Payer: 59 | Admitting: Nurse Practitioner

## 2024-04-15 ENCOUNTER — Ambulatory Visit: Admitting: Internal Medicine

## 2024-04-16 ENCOUNTER — Encounter: Payer: Self-pay | Admitting: Internal Medicine

## 2024-04-16 ENCOUNTER — Ambulatory Visit: Admitting: Internal Medicine

## 2024-04-16 VITALS — BP 133/88 | HR 87 | Temp 98.3°F | Resp 16 | Ht 73.0 in | Wt 279.4 lb

## 2024-04-16 DIAGNOSIS — J45909 Unspecified asthma, uncomplicated: Secondary | ICD-10-CM

## 2024-04-16 DIAGNOSIS — G4733 Obstructive sleep apnea (adult) (pediatric): Secondary | ICD-10-CM

## 2024-04-16 NOTE — Progress Notes (Signed)
 [facility] [address] [place], [place] [address]  Pulmonary Sleep Medicine   Office Visit Note  Patient Name: Luis Hendrix. DOB: 1965-11-18 MRN [id number]  Date of Service: 04/16/2024  Complaints/HPI: He has been doing well overall. His PFT shows mild restrictive likely from his chest size. Last CXR done in 2017 warrants a repeat cxr. His OSA is under control patient is on CPAP. He does need to still work on [employer] loss  Office Spirometry Results:     ROS  General: (-) fever, (-) chills, (-) night sweats, (-) weakness Skin: (-) rashes, (-) itching,. Eyes: (-) visual changes, (-) redness, (-) itching. Nose and Sinuses: (-) nasal stuffiness or itchiness, (-) postnasal drip, (-) nosebleeds, (-) sinus trouble. Mouth and Throat: (-) sore throat, (-) hoarseness. Neck: (-) swollen glands, (-) enlarged thyroid, (-) neck pain. Respiratory: - cough, (-) bloody sputum, - shortness of breath, - wheezing. Cardiovascular: - ankle swelling, (-) chest pain. Lymphatic: (-) lymph node enlargement. Neurologic: (-) numbness, (-) tingling. Psychiatric: (-) anxiety, (-) depression   Current Medication: Outpatient Encounter Medications as of 04/16/2024  Medication Sig   albuterol  (VENTOLIN  HFA) 108 (90 Base) MCG/ACT inhaler Inhale 1-2 puffs into the lungs every 6 (six) hours as needed for wheezing or shortness of breath.   budesonide -formoterol  (SYMBICORT ) 160-4.5 MCG/ACT inhaler Inhale 2 puffs into the lungs in the morning and at bedtime.   colchicine  0.6 MG tablet TAKE 1 TABLET BY MOUTH DAILY AS NEEDED FOR ACUTE GOUT FLARE   lisinopril  (ZESTRIL ) 20 MG tablet TAKE 1 TABLET BY MOUTH DAILY   rosuvastatin  (CRESTOR ) 5 MG tablet Take 1 tablet by mouth twice per week.   topiramate  (TOPAMAX ) 25 MG tablet TAKE 1 TABLET BY MOUTH 2 TIMES A DAY   No facility-administered encounter medications on file as of 04/16/2024.    Surgical History: Past Surgical History:  Procedure  Laterality Date   COLONOSCOPY WITH PROPOFOL  N/A 02/26/2020   Procedure: COLONOSCOPY WITH PROPOFOL ;  Surgeon: Unk Corinn Skiff, MD;  Location: [facility] ENDOSCOPY;  Service: Gastroenterology;  Laterality: N/A;   DIAGNOSTIC LAPAROSCOPY     ELBOW FRACTURE SURGERY Right    Approx age 50   FRACTURE SURGERY     LAPAROSCOPIC GASTRIC SLEEVE RESECTION      Medical History: Past Medical History:  Diagnosis Date   Asthma    Hypertension    Sleep apnea     Family History: Family History  Problem Relation Age of Onset   Hypertension Father     Social History: Social History   Socioeconomic History   Marital status: Married    Spouse name: Not on file   Number of children: Not on file   Years of education: Not on file   Highest education level: Not on file  Occupational History   Not on file  Tobacco Use   Smoking status: Never   Smokeless tobacco: Never  Vaping Use   Vaping status: Never Used  Substance and Sexual Activity   Alcohol use: Yes    Comment: OCCASIONALLY   Drug use: No   Sexual activity: Yes    Partners: Female  Other Topics Concern   Not on file  Social History Narrative   Not on file   Social Drivers of Health   Financial Resource Strain: Not on file  Food Insecurity: Not on file  Transportation Needs: Not on file  Physical Activity: Not on file  Stress: Not on file  Social Connections: Not on file  Intimate Partner Violence: Not on file    Vital Signs: Blood pressure 133/88, pulse 87, temperature 98.3 F (36.8 C), resp. rate 16, height 6' 1 (1.854 m), weight 279 lb 6.4 oz (126.7 kg), SpO2 99%.  Examination: General Appearance: The patient is well-developed, well-nourished, and in no distress. Skin: Gross inspection of skin unremarkable. Head: normocephalic, no gross deformities. Eyes: no gross deformities noted. ENT: ears appear grossly normal no exudates. Neck: Supple. No thyromegaly. No LAD. Respiratory: no rhonchi noted. Cardiovascular:  Normal S1 and S2 without murmur or rub. Extremities: No cyanosis. pulses are equal. Neurologic: Alert and oriented. No involuntary movements.  LABS: Recent Results (from the past 2160 hours)  CBC w/Diff/Platelet     Status: Abnormal   Collection Time: 03/22/24  8:02 AM  Result Value Ref Range   WBC 4.3 3.4 - 10.8 x10E3/uL   RBC 5.35 4.14 - 5.80 x10E6/uL   Hemoglobin 14.5 13.0 - 17.7 g/dL   Hematocrit 52.6 62.4 - 51.0 %   MCV 88 79 - 97 fL   MCH 27.1 26.6 - 33.0 pg   MCHC 30.7 (L) 31.5 - 35.7 g/dL   RDW 87.2 88.3 - 84.5 %   Platelets 208 150 - 450 x10E3/uL   Neutrophils 50 Not Estab. %   Lymphs 39 Not Estab. %   Monocytes 7 Not Estab. %   Eos 3 Not Estab. %   Basos 1 Not Estab. %   Neutrophils Absolute 2.2 1.4 - 7.0 x10E3/uL   Lymphocytes Absolute 1.7 0.7 - 3.1 x10E3/uL   Monocytes Absolute 0.3 0.1 - 0.9 x10E3/uL   EOS (ABSOLUTE) 0.1 0.0 - 0.4 x10E3/uL   Basophils Absolute 0.0 0.0 - 0.2 x10E3/uL   Immature Granulocytes 0 Not Estab. %   Immature Grans (Abs) 0.0 0.0 - 0.1 x10E3/uL  Comprehensive metabolic panel with GFR     Status: None   Collection Time: 03/22/24  8:02 AM  Result Value Ref Range   Glucose 81 70 - 99 mg/dL   BUN 13 6 - 24 mg/dL   Creatinine, Ser 8.78 0.76 - 1.27 mg/dL   eGFR 71 >40 fO/fpw/8.26   BUN/Creatinine Ratio 11 9 - 20   Sodium 139 134 - 144 mmol/L   Potassium 4.6 3.5 - 5.2 mmol/L   Chloride 102 96 - 106 mmol/L   CO2 21 20 - 29 mmol/L   Calcium  9.9 8.7 - 10.2 mg/dL   Total Protein 7.0 6.0 - 8.5 g/dL   Albumin 4.6 3.8 - 4.9 g/dL   Globulin, Total 2.4 1.5 - 4.5 g/dL   Bilirubin Total 0.6 0.0 - 1.2 mg/dL   Alkaline Phosphatase 67 44 - 121 IU/L    Comment: **Effective April 01, 2024 Alkaline Phosphatase**   reference interval will be changing to:              Age                Male          Male           0 -  5 days         47 - 127       47 - 127           6 - 10 days         29 - 242       29 - 242          11 - 20 days  109 - 357       109 - 357          21 - 30 days         94 - 494       94 - 494           1 -  2 months      149 - 539      149 - 539           3 -  6 months      131 - 452      131 - 452           7 - 11 months      117 - 401      117 - 401   12 months -  6 years       158 - 369      158 - 369           7 - 12 years       150 - 409      150 - 409               13 years       156 - 435       78 - 227               14 years       114 - 375       64 - 161               15 years        88 - 279       56 - 134               16 years        74 - 207       51 - 121               17 years        63 - 161       47 - 113          18 - 20 years        51 - 125       42 - 106          21 - 50 years         47 - 123       41 - 116          51 - 80 years        49 - 135       51 - 125              >80 years        48 - 129       48 - 129    AST 31 0 - 40 IU/L   ALT 28 0 - 44 IU/L  TSH + free T4     Status: None   Collection Time: 03/22/24  8:02 AM  Result Value Ref Range   TSH 2.080 0.450 - 4.500 uIU/mL   Free T4 1.18 0.82 - 1.77 ng/dL  Lipid Panel With LDL/HDL Ratio     Status: None   Collection Time: 03/22/24  8:02 AM  Result Value Ref Range   Cholesterol, Total 156 100 - 199 mg/dL   Triglycerides 52 0 - 149 mg/dL   HDL  69 >39 mg/dL   VLDL Cholesterol Cal 11 5 - 40 mg/dL   LDL Chol Calc (NIH) 76 0 - 99 mg/dL   LDL/HDL Ratio 1.1 0.0 - 3.6 ratio    Comment:                                     LDL/HDL Ratio                                             Men  Women                               1/2 Avg.Risk  1.0    1.5                                   Avg.Risk  3.6    3.2                                2X Avg.Risk  6.2    5.0                                3X Avg.Risk  8.0    6.1   VITAMIN D  25 Hydroxy (Vit-D Deficiency, Fractures)     Status: None   Collection Time: 03/22/24  8:02 AM  Result Value Ref Range   Vit D, 25-Hydroxy 33.7 30.0 - 100.0 ng/mL    Comment: Vitamin D  deficiency has been defined  by the Institute of Medicine and an Endocrine Society practice guideline as a level of serum 25-OH vitamin D  less than 20 ng/mL (1,2). The Endocrine Society went on to further define vitamin D  insufficiency as a level between 21 and 29 ng/mL (2). 1. IOM (Institute of Medicine). 2010. Dietary reference    intakes for calcium  and D. [place]: The    [employer]. 2. Holick MF, [place], Bischoff-Ferrari HA, et al.    Evaluation, treatment, and prevention of vitamin D     deficiency: an Endocrine Society clinical practice    guideline. JCEM. 2011 Jul; 96(7):1911-30.   PSA Total (Reflex To Free)     Status: None   Collection Time: 03/22/24  8:02 AM  Result Value Ref Range   Prostate Specific Ag, Serum 0.8 0.0 - 4.0 ng/mL    Comment: Roche ECLIA methodology. According to the American Urological Association, Serum PSA should decrease and remain at undetectable levels after radical prostatectomy. The AUA defines biochemical recurrence as an initial PSA value 0.2 ng/mL or greater followed by a subsequent confirmatory PSA value 0.2 ng/mL or greater. Values obtained with different assay methods or kits cannot be used interchangeably. Results cannot be interpreted as absolute evidence of the presence or absence of malignant disease.    Reflex Criteria Comment     Comment: The percent free PSA is performed on a reflex basis only when the total PSA is between 4.0 and 10.0 ng/mL.   Pulmonary Function Test     Status: None   Collection Time: 03/27/24  8:07 AM  Result  Value Ref Range   FEV1     FVC     FEV1/FVC     TLC     DLCO      Radiology: No results found.  No results found.  No results found.  Assessment and Plan: Patient Active Problem List   Diagnosis Date Noted   Bilateral carotid bruits 02/24/2022   Moderate persistent asthma without complication 07/26/2020   BMI 35.0-35.9,adult 07/26/2020   Encounter for routine adult health examination with abnormal  findings 05/24/2020   Dysuria 05/24/2020   Encounter for screening colonoscopy 02/04/2020   Hyperlipidemia LDL goal <100 04/23/2019   Moderate asthma without complication 12/15/2017   Essential hypertension 02/10/2016   Heartburn 02/10/2016   Moderate obesity 02/10/2016   Obstructive sleep apnea syndrome 02/10/2016    1. Moderate asthma without complication, unspecified whether persistent (Primary) He is on albuterol  rescue and uses symbicort  for maintenance - DG Chest 2 View; Future  2. OSA (obstructive sleep apnea) On PAP therapy and doing well  3. Obesity, morbid (HCC) Work on diet and exercise   General Counseling: I have discussed the findings of the evaluation and examination with Zachary.  I have also discussed any further diagnostic evaluation thatmay be needed or ordered today. Fordyce verbalizes understanding of the findings of todays visit. We also reviewed his medications today and discussed drug interactions and side effects including but not limited excessive drowsiness and altered mental states. We also discussed that there is always a risk not just to him but also people around him. he has been encouraged to call the office with any questions or concerns that should arise related to todays visit.  No orders of the defined types were placed in this encounter.    Time spent: 57  I have personally obtained a history, examined the patient, evaluated laboratory and imaging results, formulated the assessment and plan and placed orders.    Elfreda DELENA Bathe, MD [facility]monary [facility] Sleep medicine

## 2024-04-17 ENCOUNTER — Other Ambulatory Visit: Payer: Self-pay | Admitting: Nurse Practitioner

## 2024-04-17 DIAGNOSIS — R0602 Shortness of breath: Secondary | ICD-10-CM

## 2024-04-17 DIAGNOSIS — J4540 Moderate persistent asthma, uncomplicated: Secondary | ICD-10-CM

## 2024-04-17 MED ORDER — BUDESONIDE-FORMOTEROL FUMARATE 160-4.5 MCG/ACT IN AERO: 2.0000 | INHALATION_SPRAY | Freq: Two times a day (BID) | RESPIRATORY_TRACT | 3 refills | Status: AC

## 2024-04-18 ENCOUNTER — Ambulatory Visit
Admission: RE | Admit: 2024-04-18 | Discharge: 2024-04-18 | Disposition: A | Source: Ambulatory Visit | Attending: Internal Medicine | Admitting: Internal Medicine

## 2024-04-18 ENCOUNTER — Ambulatory Visit
Admission: RE | Admit: 2024-04-18 | Discharge: 2024-04-18 | Disposition: A | Discharge status: Home / Self Care | Attending: Internal Medicine | Admitting: Internal Medicine

## 2024-04-18 DIAGNOSIS — J45909 Unspecified asthma, uncomplicated: Secondary | ICD-10-CM

## 2024-05-30 ENCOUNTER — Ambulatory Visit (INDEPENDENT_AMBULATORY_CARE_PROVIDER_SITE_OTHER): Payer: 59 | Admitting: Physician Assistant

## 2024-05-30 VITALS — BP 118/80 | HR 61 | Temp 98.0°F | Resp 16 | Ht 73.0 in | Wt 283.0 lb

## 2024-05-30 DIAGNOSIS — I10 Essential (primary) hypertension: Secondary | ICD-10-CM | POA: Diagnosis not present

## 2024-05-30 DIAGNOSIS — M109 Gout, unspecified: Secondary | ICD-10-CM

## 2024-05-30 DIAGNOSIS — E669 Obesity, unspecified: Secondary | ICD-10-CM

## 2024-05-30 DIAGNOSIS — E785 Hyperlipidemia, unspecified: Secondary | ICD-10-CM | POA: Diagnosis not present

## 2024-05-30 DIAGNOSIS — Z0001 Encounter for general adult medical examination with abnormal findings: Secondary | ICD-10-CM

## 2024-05-30 DIAGNOSIS — R300 Dysuria: Secondary | ICD-10-CM

## 2024-05-30 MED ORDER — COLCHICINE 0.6 MG PO TABS: ORAL_TABLET | ORAL | 0 refills | Status: AC

## 2024-05-30 MED ORDER — TOPIRAMATE 25 MG PO TABS: 25.0000 mg | ORAL_TABLET | Freq: Two times a day (BID) | ORAL | 3 refills | Status: DC

## 2024-05-30 NOTE — Progress Notes (Signed)
 [facility] [address] [place], [place] [address]  Internal MEDICINE  Office Visit Note  Patient Name: Luis Hendrix  [id number]  [id number]  Date of Service: 05/30/2024  Chief Complaint  Patient presents with   Annual Exam   Hypertension   Medication Refill    Topamax  and Colchicine      HPI Pt is here for routine health maintenance examination -UTD on colonoscopy -Labs reviewed: LDL greatly improved, overall labs look good. -Taking multivitamin, plans to restart vit D supplement -taking colchicine  for acute flare. One flare about 1 month ago and it took colchicine  and did well -asthma doing well, saw pulmonology a few months ago -exercising 5days per week, Tues-Sat does boot camp and spin classes. Thinking about adding a walk in as well  -CPAP at night  -BP very well controlled -[place] over the holidays  Current Medication: Outpatient Encounter Medications as of 05/30/2024  Medication Sig   albuterol  (VENTOLIN  HFA) 108 (90 Base) MCG/ACT inhaler INHALE 1-2 PUFFS INTO THE LUNGS EVERY 6 HOURS AS NEEDED FOR WHEEZING OR SHORTNESS OF BREATH   budesonide -formoterol  (SYMBICORT ) 160-4.5 MCG/ACT inhaler Inhale 2 puffs into the lungs in the morning and at bedtime.   lisinopril  (ZESTRIL ) 20 MG tablet TAKE 1 TABLET BY MOUTH DAILY   rosuvastatin  (CRESTOR ) 5 MG tablet Take 1 tablet by mouth twice per week.   [DISCONTINUED] colchicine  0.6 MG tablet TAKE 1 TABLET BY MOUTH DAILY AS NEEDED FOR ACUTE GOUT FLARE   [DISCONTINUED] topiramate  (TOPAMAX ) 25 MG tablet TAKE 1 TABLET BY MOUTH 2 TIMES A DAY   colchicine  0.6 MG tablet Take 1 tablet by mouth daily as needed for acute gout flare.   topiramate  (TOPAMAX ) 25 MG tablet Take 1 tablet (25 mg total) by mouth 2 (two) times daily.   No facility-administered encounter medications on file as of 05/30/2024.    Surgical History: Past Surgical History:  Procedure Laterality Date   COLONOSCOPY  WITH PROPOFOL  N/A 02/26/2020   Procedure: COLONOSCOPY WITH PROPOFOL ;  Surgeon: Unk Corinn Skiff, MD;  Location: [facility] ENDOSCOPY;  Service: Gastroenterology;  Laterality: N/A;   DIAGNOSTIC LAPAROSCOPY     ELBOW FRACTURE SURGERY Right    Approx age 72   FRACTURE SURGERY     LAPAROSCOPIC GASTRIC SLEEVE RESECTION      Medical History: Past Medical History:  Diagnosis Date   Asthma    Hypertension    Sleep apnea     Family History: Family History  Problem Relation Age of Onset   Hypertension Father       Review of Systems  Constitutional:  Negative for chills, fatigue and unexpected weight change.  HENT:  Negative for congestion, postnasal drip, rhinorrhea, sneezing and sore throat.   Eyes:  Negative for redness.  Respiratory:  Negative for cough, chest tightness and shortness of breath.   Cardiovascular:  Negative for chest pain and palpitations.  Gastrointestinal:  Negative for abdominal pain, constipation, diarrhea, nausea and vomiting.  Genitourinary:  Negative for dysuria and frequency.  Musculoskeletal:  Negative for arthralgias, back pain, joint swelling and neck pain.  Skin:  Negative for rash.  Neurological:  Negative for dizziness, tremors, light-headedness and numbness.  Hematological:  Negative for adenopathy. Does not bruise/bleed easily.  Psychiatric/Behavioral:  Negative for behavioral problems (Depression), sleep disturbance and suicidal ideas. The patient is not nervous/anxious.      Vital Signs: BP 118/80   Pulse 61   Temp 98 F (36.7 C)  Resp 16   Ht 6' 1 (1.854 m)   Wt 283 lb (128.4 kg)   SpO2 99%   BMI 37.34 kg/m    Physical Exam Vitals and nursing note reviewed.  Constitutional:      General: He is not in acute distress.    Appearance: Normal appearance. He is well-developed. He is obese. He is not diaphoretic.  HENT:     Head: Normocephalic and atraumatic.     Mouth/Throat:     Pharynx: No posterior oropharyngeal erythema.  Eyes:      Extraocular Movements: Extraocular movements intact.  Neck:     Thyroid: No thyromegaly.     Vascular: No JVD.     Trachea: No tracheal deviation.  Cardiovascular:     Rate and Rhythm: Normal rate and regular rhythm.     Heart sounds: Normal heart sounds.  Pulmonary:     Effort: Pulmonary effort is normal. No respiratory distress.     Breath sounds: No wheezing or rales.  Abdominal:     General: Bowel sounds are normal.     Tenderness: There is no abdominal tenderness.  Musculoskeletal:        General: Normal range of motion.  Skin:    General: Skin is warm and dry.  Neurological:     Mental Status: He is alert and oriented to person, place, and time.  Psychiatric:        Behavior: Behavior normal.        Thought Content: Thought content normal.        Judgment: Judgment normal.      LABS: Recent Results (from the past 2160 hours)  CBC w/Diff/Platelet     Status: Abnormal   Collection Time: 03/22/24  8:02 AM  Result Value Ref Range   WBC 4.3 3.4 - 10.8 x10E3/uL   RBC 5.35 4.14 - 5.80 x10E6/uL   Hemoglobin 14.5 13.0 - 17.7 g/dL   Hematocrit 52.6 62.4 - 51.0 %   MCV 88 79 - 97 fL   MCH 27.1 26.6 - 33.0 pg   MCHC 30.7 (L) 31.5 - 35.7 g/dL   RDW 87.2 88.3 - 84.5 %   Platelets 208 150 - 450 x10E3/uL   Neutrophils 50 Not Estab. %   Lymphs 39 Not Estab. %   Monocytes 7 Not Estab. %   Eos 3 Not Estab. %   Basos 1 Not Estab. %   Neutrophils Absolute 2.2 1.4 - 7.0 x10E3/uL   Lymphocytes Absolute 1.7 0.7 - 3.1 x10E3/uL   Monocytes Absolute 0.3 0.1 - 0.9 x10E3/uL   EOS (ABSOLUTE) 0.1 0.0 - 0.4 x10E3/uL   Basophils Absolute 0.0 0.0 - 0.2 x10E3/uL   Immature Granulocytes 0 Not Estab. %   Immature Grans (Abs) 0.0 0.0 - 0.1 x10E3/uL  Comprehensive metabolic panel with GFR     Status: None   Collection Time: 03/22/24  8:02 AM  Result Value Ref Range   Glucose 81 70 - 99 mg/dL   BUN 13 6 - 24 mg/dL   Creatinine, Ser 8.78 0.76 - 1.27 mg/dL   eGFR 71 >40 fO/fpw/8.26    BUN/Creatinine Ratio 11 9 - 20   Sodium 139 134 - 144 mmol/L   Potassium 4.6 3.5 - 5.2 mmol/L   Chloride 102 96 - 106 mmol/L   CO2 21 20 - 29 mmol/L   Calcium  9.9 8.7 - 10.2 mg/dL   Total Protein 7.0 6.0 - 8.5 g/dL   Albumin 4.6 3.8 - 4.9 g/dL  Globulin, Total 2.4 1.5 - 4.5 g/dL   Bilirubin Total 0.6 0.0 - 1.2 mg/dL   Alkaline Phosphatase 67 44 - 121 IU/L    Comment: **Effective April 01, 2024 Alkaline Phosphatase**   reference interval will be changing to:              Age                Male          Male           0 -  5 days         47 - 127       47 - 127           6 - 10 days         29 - 242       29 - 242          11 - 20 days        109 - 357      109 - 357          21 - 30 days         94 - 494       94 - 494           1 -  2 months      149 - 539      149 - 539           3 -  6 months      131 - 452      131 - 452           7 - 11 months      117 - 401      117 - 401   12 months -  6 years       158 - 369      158 - 369           7 - 12 years       150 - 409      150 - 409               13 years       156 - 435       78 - 227               14 years       114 - 375       64 - 161               15 years        88 - 279       56 - 134               16 years        74 - 207       51 - 121               17 years        63 - 161       47 - 113          18 - 20 years        51 - 125       42 - 106          21 - 50 years         47 - 123       49 -  116          51 - 80 years        49 - 135       51 - 125              >80 years        48 - 129       48 - 129    AST 31 0 - 40 IU/L   ALT 28 0 - 44 IU/L  TSH + free T4     Status: None   Collection Time: 03/22/24  8:02 AM  Result Value Ref Range   TSH 2.080 0.450 - 4.500 uIU/mL   Free T4 1.18 0.82 - 1.77 ng/dL  Lipid Panel With LDL/HDL Ratio     Status: None   Collection Time: 03/22/24  8:02 AM  Result Value Ref Range   Cholesterol, Total 156 100 - 199 mg/dL   Triglycerides 52 0 - 149 mg/dL   HDL 69 >60 mg/dL    VLDL Cholesterol Cal 11 5 - 40 mg/dL   LDL Chol Calc (NIH) 76 0 - 99 mg/dL   LDL/HDL Ratio 1.1 0.0 - 3.6 ratio    Comment:                                     LDL/HDL Ratio                                             Men  Women                               1/2 Avg.Risk  1.0    1.5                                   Avg.Risk  3.6    3.2                                2X Avg.Risk  6.2    5.0                                3X Avg.Risk  8.0    6.1   VITAMIN D  25 Hydroxy (Vit-D Deficiency, Fractures)     Status: None   Collection Time: 03/22/24  8:02 AM  Result Value Ref Range   Vit D, 25-Hydroxy 33.7 30.0 - 100.0 ng/mL    Comment: Vitamin D  deficiency has been defined by the Institute of Medicine and an Endocrine Society practice guideline as a level of serum 25-OH vitamin D  less than 20 ng/mL (1,2). The Endocrine Society went on to further define vitamin D  insufficiency as a level between 21 and 29 ng/mL (2). 1. IOM (Institute of Medicine). 2010. Dietary reference    intakes for calcium  and D. [place]: The    [employer]. 2. Holick MF, [place], Bischoff-Ferrari HA, et al.    Evaluation, treatment, and prevention of vitamin D     deficiency: an Endocrine Society clinical practice    guideline. JCEM.  2011 Jul; 96(7):1911-30.   PSA Total (Reflex To Free)     Status: None   Collection Time: 03/22/24  8:02 AM  Result Value Ref Range   Prostate Specific Ag, Serum 0.8 0.0 - 4.0 ng/mL    Comment: Roche ECLIA methodology. According to the American Urological Association, Serum PSA should decrease and remain at undetectable levels after radical prostatectomy. The AUA defines biochemical recurrence as an initial PSA value 0.2 ng/mL or greater followed by a subsequent confirmatory PSA value 0.2 ng/mL or greater. Values obtained with different assay methods or kits cannot be used interchangeably. Results cannot be interpreted as absolute evidence of the presence or absence  of malignant disease.    Reflex Criteria Comment     Comment: The percent free PSA is performed on a reflex basis only when the total PSA is between 4.0 and 10.0 ng/mL.   Pulmonary Function Test     Status: None   Collection Time: 03/27/24  8:07 AM  Result Value Ref Range   FEV1     FVC     FEV1/FVC     TLC     DLCO          Assessment/Plan: 1. Encounter for general adult medical examination with abnormal findings (Primary) CPE performed, labs reviewed, due for PNA vaccine, UTD on colonoscopy  2. Essential hypertension Well controlled, continue  lisinopril   3. Hyperlipidemia LDL goal <100 Improved, continue crestor  2x/week  4. Acute gout, unspecified cause, unspecified site Infrequent flares, but does need colchicine  to use as needed. Refills sent  5. Obesity (BMI 30-39.9) Working on increasing exercise and avoiding late night snacks  6. Dysuria - UA/M w/rflx Culture, Routine   General Counseling: roi jafari understanding of the findings of todays visit and agrees with plan of treatment. I have discussed any further diagnostic evaluation that may be needed or ordered today. We also reviewed his medications today. he has been encouraged to call the office with any questions or concerns that should arise related to todays visit.    Counseling:    Orders Placed This Encounter  Procedures   UA/M w/rflx Culture, Routine    Meds ordered this encounter  Medications   topiramate  (TOPAMAX ) 25 MG tablet    Sig: Take 1 tablet (25 mg total) by mouth 2 (two) times daily.    Dispense:  30 tablet    Refill:  3   colchicine  0.6 MG tablet    Sig: Take 1 tablet by mouth daily as needed for acute gout flare.    Dispense:  30 tablet    Refill:  0    This patient was seen by Tinnie Pro, PA-C in collaboration with Dr. Sigrid Bathe as a part of collaborative care agreement.  Total time spent:35 Minutes  Time spent includes review of chart, medications, test  results, and follow up plan with the patient.     Sigrid CHRISTELLA Bathe, MD  Internal Medicine

## 2024-05-31 LAB — MICROSCOPIC EXAMINATION
Bacteria, UA: NONE SEEN
Casts: NONE SEEN /LPF
Epithelial Cells (non renal): NONE SEEN /HPF (ref 0–10)
RBC, Urine: NONE SEEN /HPF (ref 0–2)
WBC, UA: NONE SEEN /HPF (ref 0–5)

## 2024-05-31 LAB — UA/M W/RFLX CULTURE, ROUTINE
Bilirubin, UA: NEGATIVE
Glucose, UA: NEGATIVE
Ketones, UA: NEGATIVE
Leukocytes,UA: NEGATIVE
Nitrite, UA: NEGATIVE
Protein,UA: NEGATIVE
RBC, UA: NEGATIVE
Specific Gravity, UA: 1.013 (ref 1.005–1.030)
Urobilinogen, Ur: 1 mg/dL (ref 0.2–1.0)
pH, UA: 6.5 (ref 5.0–7.5)

## 2024-06-11 ENCOUNTER — Other Ambulatory Visit: Payer: Self-pay

## 2024-06-11 DIAGNOSIS — H5340 Unspecified visual field defects: Secondary | ICD-10-CM

## 2024-06-24 ENCOUNTER — Telehealth (INDEPENDENT_AMBULATORY_CARE_PROVIDER_SITE_OTHER): Admitting: Physician Assistant

## 2024-06-24 ENCOUNTER — Encounter: Payer: Self-pay | Admitting: Physician Assistant

## 2024-06-24 VITALS — Resp 16 | Ht 73.0 in | Wt 275.0 lb

## 2024-06-24 DIAGNOSIS — J4540 Moderate persistent asthma, uncomplicated: Secondary | ICD-10-CM

## 2024-06-24 DIAGNOSIS — J069 Acute upper respiratory infection, unspecified: Secondary | ICD-10-CM

## 2024-06-24 MED ORDER — AZITHROMYCIN 250 MG PO TABS: ORAL_TABLET | ORAL | 0 refills | Status: AC

## 2024-06-24 NOTE — Progress Notes (Signed)
 [facility] [address] [place], [place] [address]  Internal MEDICINE  Telephone Visit  Patient Name: Luis Hendrix  [id number]  [id number]  Date of Service: 06/24/2024  I connected with the patient at 10:04 by telephone and verified the patients identity using two identifiers.   I discussed the limitations, risks, security and privacy concerns of performing an evaluation and management service by telephone and the availability of in person appointments. I also discussed with the patient that there may be a patient responsible charge related to the service.  The patient expressed understanding and agrees to proceed.    Chief Complaint  Patient presents with   Telephone Screen   Telephone Assessment    Going for 2 weeks    Sinusitis   Cough    HPI Pt is here for virtual sick visit -chest congestion and cough going on for 2 weeks -Denies SOB, fever, chills, HA -a little wheezing at night, some productive cough, a little yellow. Feels like he can't get everything out when he coughs. -tried robitussin, mucinex -using inhalers  Current Medication: Outpatient Encounter Medications as of 06/24/2024  Medication Sig   albuterol  (VENTOLIN  HFA) 108 (90 Base) MCG/ACT inhaler INHALE 1-2 PUFFS INTO THE LUNGS EVERY 6 HOURS AS NEEDED FOR WHEEZING OR SHORTNESS OF BREATH   azithromycin  (ZITHROMAX ) 250 MG tablet Take 2 tablets on day 1, then 1 tablet daily on days 2 through 5   budesonide -formoterol  (SYMBICORT ) 160-4.5 MCG/ACT inhaler Inhale 2 puffs into the lungs in the morning and at bedtime.   colchicine  0.6 MG tablet Take 1 tablet by mouth daily as needed for acute gout flare.   lisinopril  (ZESTRIL ) 20 MG tablet TAKE 1 TABLET BY MOUTH DAILY   rosuvastatin  (CRESTOR ) 5 MG tablet Take 1 tablet by mouth twice per week.   topiramate  (TOPAMAX ) 25 MG tablet Take 1 tablet (25 mg total) by mouth 2 (two) times daily.   No facility-administered encounter medications on file as of  06/24/2024.    Surgical History: Past Surgical History:  Procedure Laterality Date   COLONOSCOPY WITH PROPOFOL  N/A 02/26/2020   Procedure: COLONOSCOPY WITH PROPOFOL ;  Surgeon: Unk Corinn Skiff, MD;  Location: [facility] ENDOSCOPY;  Service: Gastroenterology;  Laterality: N/A;   DIAGNOSTIC LAPAROSCOPY     ELBOW FRACTURE SURGERY Right    Approx age 46   FRACTURE SURGERY     LAPAROSCOPIC GASTRIC SLEEVE RESECTION      Medical History: Past Medical History:  Diagnosis Date   Asthma    Hypertension    Sleep apnea     Family History: Family History  Problem Relation Age of Onset   Hypertension Father     Social History   Socioeconomic History   Marital status: Married    Spouse name: Not on file   Number of children: Not on file   Years of education: Not on file   Highest education level: Not on file  Occupational History   Not on file  Tobacco Use   Smoking status: Never   Smokeless tobacco: Never  Vaping Use   Vaping status: Never Used  Substance and Sexual Activity   Alcohol use: Yes    Comment: OCCASIONALLY   Drug use: No   Sexual activity: Yes    Partners: Female  Other Topics Concern   Not on file  Social History Narrative   Not on file   Social Drivers of Health   Financial Resource Strain: Not on file  Food Insecurity: Not  on file  Transportation Needs: Not on file  Physical Activity: Not on file  Stress: Not on file  Social Connections: Not on file  Intimate Partner Violence: Not on file      Review of Systems  Constitutional:  Negative for chills, fatigue and fever.  HENT:  Positive for congestion and postnasal drip. Negative for mouth sores.   Respiratory:  Positive for cough and wheezing. Negative for shortness of breath.   Cardiovascular:  Negative for chest pain.  Genitourinary:  Negative for flank pain.  Neurological:  Negative for headaches.  Psychiatric/Behavioral: Negative.      Vital Signs: Resp 16   Ht 6' 1 (1.854 m)   Wt 275 lb  (124.7 kg)   BMI 36.28 kg/m    Observation/Objective:  Pt is able to carry out conversation  Assessment/Plan: 1. Upper respiratory tract infection, unspecified type (Primary) Will start zpak and may continue mucinex. Rest and stay hydrated - azithromycin  (ZITHROMAX ) 250 MG tablet; Take 2 tablets on day 1, then 1 tablet daily on days 2 through 5  Dispense: 6 tablet; Refill: 0  2. Moderate persistent asthma without complication Continue inhalers as prescribed   General Counseling: kveon casanas understanding of the findings of today's phone visit and agrees with plan of treatment. I have discussed any further diagnostic evaluation that may be needed or ordered today. We also reviewed his medications today. he has been encouraged to call the office with any questions or concerns that should arise related to todays visit.    No orders of the defined types were placed in this encounter.   Meds ordered this encounter  Medications   azithromycin  (ZITHROMAX ) 250 MG tablet    Sig: Take 2 tablets on day 1, then 1 tablet daily on days 2 through 5    Dispense:  6 tablet    Refill:  0    Time spent:20 Minutes    Dr Sigrid CHRISTELLA [facility]

## 2024-06-26 ENCOUNTER — Inpatient Hospital Stay: Admission: RE | Admit: 2024-06-26 | Discharge: 2024-06-26 | Disposition: A | Source: Ambulatory Visit

## 2024-06-26 ENCOUNTER — Ambulatory Visit
Admission: RE | Admit: 2024-06-26 | Discharge: 2024-06-26 | Disposition: A | Discharge status: Home / Self Care | Source: Ambulatory Visit

## 2024-06-26 DIAGNOSIS — H5340 Unspecified visual field defects: Secondary | ICD-10-CM

## 2024-06-27 ENCOUNTER — Other Ambulatory Visit: Payer: Self-pay | Admitting: Physician Assistant

## 2024-06-27 ENCOUNTER — Telehealth: Payer: Self-pay

## 2024-06-27 MED ORDER — DIAZEPAM 5 MG PO TABS: ORAL_TABLET | ORAL | 0 refills | Status: AC

## 2024-06-27 NOTE — Telephone Encounter (Signed)
 Pt advised we sent med make sur someone drive him for MRI

## 2024-06-27 NOTE — Telephone Encounter (Signed)
 Valium sent for one time use for MRI, must have driver

## 2024-07-22 ENCOUNTER — Ambulatory Visit: Admission: RE | Admit: 2024-07-22 | Source: Ambulatory Visit

## 2024-07-22 ENCOUNTER — Inpatient Hospital Stay: Admission: RE | Admit: 2024-07-22 | Source: Ambulatory Visit

## 2024-07-23 ENCOUNTER — Other Ambulatory Visit: Payer: Self-pay

## 2024-07-23 DIAGNOSIS — H5340 Unspecified visual field defects: Secondary | ICD-10-CM

## 2024-07-23 DIAGNOSIS — H40021 Open angle with borderline findings, high risk, right eye: Secondary | ICD-10-CM

## 2024-07-24 ENCOUNTER — Ambulatory Visit
Admission: RE | Admit: 2024-07-24 | Discharge: 2024-07-24 | Disposition: A | Discharge status: Home / Self Care | Source: Ambulatory Visit

## 2024-07-24 DIAGNOSIS — H40021 Open angle with borderline findings, high risk, right eye: Secondary | ICD-10-CM

## 2024-07-24 DIAGNOSIS — H5340 Unspecified visual field defects: Secondary | ICD-10-CM

## 2024-07-24 MED ORDER — IOPAMIDOL (ISOVUE-300) INJECTION 61%
75.0000 mL | Freq: Once | INTRAVENOUS | Status: AC | PRN
  Administered 2024-07-24: 75 mL via INTRAVENOUS

## 2024-07-25 ENCOUNTER — Other Ambulatory Visit: Payer: Self-pay | Admitting: Physician Assistant

## 2024-07-25 DIAGNOSIS — E669 Obesity, unspecified: Secondary | ICD-10-CM

## 2024-09-26 ENCOUNTER — Ambulatory Visit: Admitting: Physician Assistant

## 2025-04-02 ENCOUNTER — Encounter: Admitting: Internal Medicine

## 2025-04-14 ENCOUNTER — Ambulatory Visit: Admitting: Internal Medicine

## 2025-06-02 ENCOUNTER — Encounter: Admitting: Physician Assistant
